# Patient Record
Sex: Male | Born: 1965 | Race: White | Hispanic: No | Marital: Married | Smoking: Current every day smoker
Health system: Southern US, Community
[De-identification: ages and names within clinical notes are randomized; demographics above are authoritative.]

## PROBLEM LIST (undated history)

## (undated) DIAGNOSIS — C801 Malignant (primary) neoplasm, unspecified: Secondary | ICD-10-CM

## (undated) DIAGNOSIS — I219 Acute myocardial infarction, unspecified: Secondary | ICD-10-CM

## (undated) DIAGNOSIS — R053 Chronic cough: Secondary | ICD-10-CM

## (undated) DIAGNOSIS — R05 Cough: Secondary | ICD-10-CM

## (undated) HISTORY — PX: OTHER SURGICAL HISTORY: SHX169

## (undated) HISTORY — PX: NERVE SURGERY: SHX1016

---

## 2009-09-06 ENCOUNTER — Emergency Department (HOSPITAL_COMMUNITY): Admission: EM | Admit: 2009-09-06 | Discharge: 2009-09-06 | Payer: Self-pay | Admitting: Emergency Medicine

## 2009-10-19 ENCOUNTER — Emergency Department: Payer: Self-pay | Admitting: Emergency Medicine

## 2010-03-15 ENCOUNTER — Emergency Department: Payer: Self-pay | Admitting: Internal Medicine

## 2010-03-26 ENCOUNTER — Emergency Department (HOSPITAL_COMMUNITY)
Admission: EM | Admit: 2010-03-26 | Discharge: 2010-03-27 | Disposition: A | Discharge status: Home / Self Care | Payer: Self-pay | Attending: Emergency Medicine | Admitting: Emergency Medicine

## 2010-03-26 DIAGNOSIS — I252 Old myocardial infarction: Secondary | ICD-10-CM | POA: Insufficient documentation

## 2010-03-26 DIAGNOSIS — R1310 Dysphagia, unspecified: Secondary | ICD-10-CM | POA: Insufficient documentation

## 2010-03-26 DIAGNOSIS — J029 Acute pharyngitis, unspecified: Secondary | ICD-10-CM | POA: Insufficient documentation

## 2010-03-26 DIAGNOSIS — E7800 Pure hypercholesterolemia, unspecified: Secondary | ICD-10-CM | POA: Insufficient documentation

## 2010-03-26 DIAGNOSIS — R6883 Chills (without fever): Secondary | ICD-10-CM | POA: Insufficient documentation

## 2010-03-27 LAB — RAPID STREP SCREEN (MED CTR MEBANE ONLY): Streptococcus, Group A Screen (Direct): NEGATIVE

## 2010-03-28 ENCOUNTER — Emergency Department: Payer: Self-pay | Admitting: Emergency Medicine

## 2010-03-28 ENCOUNTER — Emergency Department (HOSPITAL_COMMUNITY)
Admission: EM | Admit: 2010-03-28 | Discharge: 2010-03-28 | Disposition: A | Discharge status: Home / Self Care | Payer: Self-pay | Attending: Emergency Medicine | Admitting: Emergency Medicine

## 2010-03-28 DIAGNOSIS — J029 Acute pharyngitis, unspecified: Secondary | ICD-10-CM | POA: Insufficient documentation

## 2010-04-20 LAB — POCT I-STAT, CHEM 8
Chloride: 105 mEq/L (ref 96–112)
Creatinine, Ser: 0.9 mg/dL (ref 0.4–1.5)
Glucose, Bld: 93 mg/dL (ref 70–99)
HCT: 41 % (ref 39.0–52.0)
Potassium: 3.6 mEq/L (ref 3.5–5.1)
Sodium: 141 mEq/L (ref 135–145)
TCO2: 25 mmol/L (ref 0–100)

## 2010-04-20 LAB — DIFFERENTIAL
Basophils Relative: 1 % (ref 0–1)
Eosinophils Absolute: 0.4 10*3/uL (ref 0.0–0.7)
Eosinophils Relative: 4 % (ref 0–5)
Lymphs Abs: 3.6 10*3/uL (ref 0.7–4.0)
Monocytes Absolute: 0.4 10*3/uL (ref 0.1–1.0)
Neutro Abs: 4.3 10*3/uL (ref 1.7–7.7)
Neutrophils Relative %: 49 % (ref 43–77)

## 2010-04-20 LAB — CBC
Hemoglobin: 13.5 g/dL (ref 13.0–17.0)
MCH: 32.4 pg (ref 26.0–34.0)
MCHC: 34.7 g/dL (ref 30.0–36.0)
Platelets: 203 10*3/uL (ref 150–400)

## 2010-08-11 ENCOUNTER — Emergency Department: Payer: Self-pay | Admitting: Emergency Medicine

## 2010-09-29 ENCOUNTER — Emergency Department: Payer: Self-pay | Admitting: Internal Medicine

## 2010-10-04 ENCOUNTER — Observation Stay: Payer: Self-pay | Admitting: Internal Medicine

## 2011-01-27 ENCOUNTER — Emergency Department: Payer: Self-pay | Admitting: Emergency Medicine

## 2011-01-30 ENCOUNTER — Emergency Department: Payer: Self-pay | Admitting: Emergency Medicine

## 2015-08-03 ENCOUNTER — Emergency Department
Admission: EM | Admit: 2015-08-03 | Discharge: 2015-08-03 | Disposition: A | Discharge status: Home / Self Care | Payer: Self-pay | Attending: Emergency Medicine | Admitting: Emergency Medicine

## 2015-08-03 ENCOUNTER — Emergency Department: Payer: Self-pay

## 2015-08-03 ENCOUNTER — Encounter: Payer: Self-pay | Admitting: Emergency Medicine

## 2015-08-03 DIAGNOSIS — F1290 Cannabis use, unspecified, uncomplicated: Secondary | ICD-10-CM | POA: Insufficient documentation

## 2015-08-03 DIAGNOSIS — R591 Generalized enlarged lymph nodes: Secondary | ICD-10-CM

## 2015-08-03 DIAGNOSIS — F17200 Nicotine dependence, unspecified, uncomplicated: Secondary | ICD-10-CM | POA: Insufficient documentation

## 2015-08-03 DIAGNOSIS — R599 Enlarged lymph nodes, unspecified: Secondary | ICD-10-CM

## 2015-08-03 DIAGNOSIS — J351 Hypertrophy of tonsils: Secondary | ICD-10-CM

## 2015-08-03 LAB — CBC WITH DIFFERENTIAL/PLATELET
BASOS PCT: 1 %
Basophils Absolute: 0.1 10*3/uL (ref 0–0.1)
Eosinophils Absolute: 0.3 10*3/uL (ref 0–0.7)
Eosinophils Relative: 3 %
HEMATOCRIT: 41.5 % (ref 40.0–52.0)
Hemoglobin: 14.3 g/dL (ref 13.0–18.0)
Lymphocytes Relative: 23 %
Lymphs Abs: 1.9 10*3/uL (ref 1.0–3.6)
MCH: 33.6 pg (ref 26.0–34.0)
MCHC: 34.5 g/dL (ref 32.0–36.0)
MCV: 97.5 fL (ref 80.0–100.0)
MONO ABS: 0.8 10*3/uL (ref 0.2–1.0)
MONOS PCT: 10 %
NEUTROS ABS: 5.5 10*3/uL (ref 1.4–6.5)
Neutrophils Relative %: 63 %
Platelets: 217 10*3/uL (ref 150–440)
RBC: 4.26 MIL/uL — ABNORMAL LOW (ref 4.40–5.90)
RDW: 14.2 % (ref 11.5–14.5)
WBC: 8.6 10*3/uL (ref 3.8–10.6)

## 2015-08-03 LAB — COMPREHENSIVE METABOLIC PANEL
ALBUMIN: 4.3 g/dL (ref 3.5–5.0)
ALT: 22 U/L (ref 17–63)
ANION GAP: 10 (ref 5–15)
AST: 30 U/L (ref 15–41)
Alkaline Phosphatase: 146 U/L — ABNORMAL HIGH (ref 38–126)
BILIRUBIN TOTAL: 0.5 mg/dL (ref 0.3–1.2)
BUN: 7 mg/dL (ref 6–20)
CO2: 25 mmol/L (ref 22–32)
Calcium: 9.3 mg/dL (ref 8.9–10.3)
Chloride: 103 mmol/L (ref 101–111)
Creatinine, Ser: 0.87 mg/dL (ref 0.61–1.24)
GLUCOSE: 94 mg/dL (ref 65–99)
POTASSIUM: 3.8 mmol/L (ref 3.5–5.1)
Sodium: 138 mmol/L (ref 135–145)
TOTAL PROTEIN: 7.9 g/dL (ref 6.5–8.1)

## 2015-08-03 LAB — POCT RAPID STREP A: Streptococcus, Group A Screen (Direct): NEGATIVE

## 2015-08-03 MED ORDER — ACETAMINOPHEN-CODEINE #3 300-30 MG PO TABS: 1.0000 | ORAL_TABLET | ORAL | Status: DC | PRN

## 2015-08-03 MED ORDER — IOPAMIDOL (ISOVUE-300) INJECTION 61%
75.0000 mL | Freq: Once | INTRAVENOUS | Status: AC | PRN
  Administered 2015-08-03: 75 mL via INTRAVENOUS
  Filled 2015-08-03: qty 75

## 2015-08-03 NOTE — ED Notes (Signed)
NAD noted at time of D/C. Pt denies questions or concerns. Pt ambulatory to the lobby at this time. Pt refused wheelchair to the lobby.  

## 2015-08-03 NOTE — ED Provider Notes (Signed)
[facility] Emergency Department Provider Note  ____________________________________________  Time seen: Approximately 8:27 AM  I have reviewed the triage vital signs and the nursing notes.   HISTORY  Chief Complaint Sore Throat   HPI Edwin Singh is a 50 y.o. male who presents to the emergency department for evaluation of a knot on the right side of his neck that has been present for the past 3 days along with dysphasia. He denies fever. He has not taken anything for pain.   History reviewed. No pertinent past medical history.  There are no active problems to display for this patient.   Past Surgical History  Procedure Laterality Date  . Arm surgery      Current Outpatient Rx  Name  Route  Sig  Dispense  Refill  . acetaminophen-codeine (TYLENOL #3) 300-30 MG tablet   Oral   Take 1-2 tablets by mouth every 4 (four) hours as needed for moderate pain.   12 tablet   0     Allergies Penicillins  History reviewed. No pertinent family history.  Social History Social History  Substance Use Topics  . Smoking status: Current Every Day Smoker  . Smokeless tobacco: None  . Alcohol Use: 12.0 oz/week    20 Cans of beer per week     Comment: 40 oz beer per day    Review of Systems Constitutional: Negative for fever. Eyes: No visual changes. ENT: Positive for sore throat; negative for difficulty swallowing. Respiratory: Denies shortness of breath. Gastrointestinal: No abdominal pain.  No nausea, no vomiting.  No diarrhea.  Musculoskeletal: Negative for generalized body aches. Skin: Negative for rash. Neurological: Negative for headaches, focal weakness or numbness.  ____________________________________________   PHYSICAL EXAM:  VITAL SIGNS: ED Triage Vitals  Enc Vitals Group     BP 08/03/15 0811 139/83 mmHg     Pulse Rate 08/03/15 0811 88     Resp 08/03/15 0811 20     Temp 08/03/15 0811 98 F (36.7 C)     Temp Source 08/03/15 0811  Oral     SpO2 08/03/15 0811 100 %     Weight 08/03/15 0811 135 lb (61.236 kg)     Height 08/03/15 0811 5\' 10"  (1.778 m)     Head Cir --      Peak Flow --      Pain Score 08/03/15 0812 5     Pain Loc --      Pain Edu? --      Excl. in [place]? --     Constitutional: Alert and oriented. Well appearing and in no acute distress. Eyes: Conjunctivae are normal. PERRL. EOMI. Head: Atraumatic. Nose: No congestion/rhinnorhea. Mouth/Throat: Mucous membranes are moist.  Oropharynx Erythematous, with exudate. Neck: No stridor. Tender, nonmobile mass noted to the right anterior cervical area, likely palpable lymph node.  Cardiovascular: Normal rate, regular rhythm. Good peripheral circulation. Respiratory: Normal respiratory effort. Lungs CTAB. Gastrointestinal: Soft and nontender. Musculoskeletal: No lower extremity tenderness nor edema.   Neurologic:  Normal speech and language. No gross focal neurologic deficits are appreciated. Speech is normal. No gait instability. Skin:  Skin is warm, dry and intact. No rash noted Psychiatric: Mood and affect are normal. Speech and behavior are normal.  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 ____________________________________________   PROCEDURES  Procedure(s) performed: None  Critical Care performed:  No  ____________________________________________   INITIAL IMPRESSION / ASSESSMENT AND PLAN / ED COURSE  Pertinent labs & imaging results that were available during my care of the patient were reviewed by me and considered in my medical decision making (see chart for details).  Rapid strep screen negative. Due to severity of pain, CT soft tissue neck were ordered along with labs. Plan was discussed with patient and significant other who agree.  Possibility of cancerous cause of mass in the right neck was discussed with the patient and significant other. He was strongly encouraged to call and schedule an appointment with ENT for a biopsy. He was advised to call today to schedule an appointment. He agrees to do so. He will be discharged home with tylenol #3 for pain.  ____________________________________________   FINAL CLINICAL IMPRESSION(S) / ED DIAGNOSES  Final diagnoses:  Adenopathy  Tonsillar enlargement      Victorino Dike, FNP 08/03/15 1517  Carrie Mew, MD 08/06/15 1435

## 2015-08-03 NOTE — ED Notes (Signed)
Pt reports a "knot" in his throat x 3 days and painful swallowing. Denies fever, chills. Hx of chronic sinus problems. Pt alert & oriented with NAD noted.

## 2015-08-07 ENCOUNTER — Encounter: Payer: Self-pay | Admitting: Emergency Medicine

## 2015-08-07 ENCOUNTER — Emergency Department
Admission: EM | Admit: 2015-08-07 | Discharge: 2015-08-08 | Disposition: A | Discharge status: Home / Self Care | Payer: Self-pay | Attending: Emergency Medicine | Admitting: Emergency Medicine

## 2015-08-07 DIAGNOSIS — F32A Depression, unspecified: Secondary | ICD-10-CM

## 2015-08-07 DIAGNOSIS — Z85818 Personal history of malignant neoplasm of other sites of lip, oral cavity, and pharynx: Secondary | ICD-10-CM | POA: Insufficient documentation

## 2015-08-07 DIAGNOSIS — F1290 Cannabis use, unspecified, uncomplicated: Secondary | ICD-10-CM | POA: Insufficient documentation

## 2015-08-07 DIAGNOSIS — Z7982 Long term (current) use of aspirin: Secondary | ICD-10-CM | POA: Insufficient documentation

## 2015-08-07 DIAGNOSIS — F329 Major depressive disorder, single episode, unspecified: Secondary | ICD-10-CM | POA: Insufficient documentation

## 2015-08-07 DIAGNOSIS — Z5181 Encounter for therapeutic drug level monitoring: Secondary | ICD-10-CM | POA: Insufficient documentation

## 2015-08-07 DIAGNOSIS — F17210 Nicotine dependence, cigarettes, uncomplicated: Secondary | ICD-10-CM | POA: Insufficient documentation

## 2015-08-07 DIAGNOSIS — F419 Anxiety disorder, unspecified: Secondary | ICD-10-CM

## 2015-08-07 DIAGNOSIS — I252 Old myocardial infarction: Secondary | ICD-10-CM | POA: Insufficient documentation

## 2015-08-07 HISTORY — DX: Malignant (primary) neoplasm, unspecified: C80.1

## 2015-08-07 NOTE — ED Notes (Addendum)
Patient reports he was diagnosed with 6/29 with cancer in his throat. Patient reports he feel "like my nerves are gone..I have so many emotions right now." Patient denies SI at this time. Patient reports he has lost many friends and family members to cancer. Patient reports "it scares me, they told me I had cancer and that was it." Patient states he wants someone to talk to, feels like he is in limbo. Spouse reports patient has not been able to eat or drink due to his anxiety.

## 2015-08-08 LAB — CBC WITH DIFFERENTIAL/PLATELET
Basophils Absolute: 0.1 10*3/uL (ref 0–0.1)
Basophils Relative: 2 %
EOS ABS: 0.3 10*3/uL (ref 0–0.7)
EOS PCT: 5 %
HCT: 44.5 % (ref 40.0–52.0)
HEMOGLOBIN: 15.7 g/dL (ref 13.0–18.0)
LYMPHS ABS: 3.3 10*3/uL (ref 1.0–3.6)
Lymphocytes Relative: 50 %
MCH: 34.4 pg — AB (ref 26.0–34.0)
MCHC: 35.3 g/dL (ref 32.0–36.0)
MCV: 97.4 fL (ref 80.0–100.0)
MONO ABS: 0.4 10*3/uL (ref 0.2–1.0)
MONOS PCT: 6 %
NEUTROS PCT: 37 %
Neutro Abs: 2.4 10*3/uL (ref 1.4–6.5)
Platelets: 189 10*3/uL (ref 150–440)
RBC: 4.57 MIL/uL (ref 4.40–5.90)
RDW: 14.1 % (ref 11.5–14.5)
WBC: 6.5 10*3/uL (ref 3.8–10.6)

## 2015-08-08 LAB — COMPREHENSIVE METABOLIC PANEL
ALBUMIN: 4.3 g/dL (ref 3.5–5.0)
ALK PHOS: 54 U/L (ref 38–126)
ALT: 31 U/L (ref 17–63)
AST: 59 U/L — AB (ref 15–41)
Anion gap: 10 (ref 5–15)
BUN: 9 mg/dL (ref 6–20)
CALCIUM: 9 mg/dL (ref 8.9–10.3)
CO2: 25 mmol/L (ref 22–32)
CREATININE: 0.76 mg/dL (ref 0.61–1.24)
Chloride: 101 mmol/L (ref 101–111)
GFR calc non Af Amer: >60 mL/min (ref >60)
GLUCOSE: 95 mg/dL (ref 65–99)
Potassium: 3.7 mmol/L (ref 3.5–5.1)
SODIUM: 136 mmol/L (ref 135–145)
Total Bilirubin: 0.4 mg/dL (ref 0.3–1.2)
Total Protein: 8.2 g/dL — ABNORMAL HIGH (ref 6.5–8.1)

## 2015-08-08 LAB — URINE DRUG SCREEN, QUALITATIVE (ARMC ONLY)
Amphetamines, Ur Screen: NOT DETECTED
Barbiturates, Ur Screen: NOT DETECTED
Benzodiazepine, Ur Scrn: NOT DETECTED
CANNABINOID 50 NG, UR ~~LOC~~: NOT DETECTED
Cocaine Metabolite,Ur ~~LOC~~: NOT DETECTED
MDMA (ECSTASY) UR SCREEN: NOT DETECTED
METHADONE SCREEN, URINE: NOT DETECTED
Opiate, Ur Screen: NOT DETECTED
PHENCYCLIDINE (PCP) UR S: NOT DETECTED
TRICYCLIC, UR SCREEN: NOT DETECTED

## 2015-08-08 LAB — URINALYSIS COMPLETE WITH MICROSCOPIC (ARMC ONLY)
BACTERIA UA: NONE SEEN
Bilirubin Urine: NEGATIVE
Glucose, UA: NEGATIVE mg/dL
Ketones, ur: NEGATIVE mg/dL
LEUKOCYTES UA: NEGATIVE
NITRITE: NEGATIVE
PH: 5 (ref 5.0–8.0)
PROTEIN: NEGATIVE mg/dL
RBC / HPF: NONE SEEN RBC/hpf (ref 0–5)
SQUAMOUS EPITHELIAL / LPF: NONE SEEN
Specific Gravity, Urine: 1.004 — ABNORMAL LOW (ref 1.005–1.030)

## 2015-08-08 LAB — ACETAMINOPHEN LEVEL: Acetaminophen (Tylenol), Serum: <10 ug/mL — ABNORMAL LOW (ref 10–30)

## 2015-08-08 LAB — ETHANOL: Alcohol, Ethyl (B): 242 mg/dL — ABNORMAL HIGH (ref <5)

## 2015-08-08 LAB — SALICYLATE LEVEL: Salicylate Lvl: <4 mg/dL (ref 2.8–30.0)

## 2015-08-08 MED ORDER — LORAZEPAM 1 MG PO TABS
1.0000 mg | ORAL_TABLET | Freq: Once | ORAL | Status: AC
  Administered 2015-08-08: 1 mg via ORAL
  Filled 2015-08-08: qty 1

## 2015-08-08 MED ORDER — LORAZEPAM 1 MG PO TABS: 1.0000 mg | ORAL_TABLET | Freq: Three times a day (TID) | ORAL | Status: DC | PRN

## 2015-08-08 NOTE — ED Provider Notes (Signed)
[facility] Emergency Department Provider Note   ____________________________________________  Time seen: Approximately 2:14 AM  I have reviewed the triage vital signs and the nursing notes.   HISTORY  Chief Complaint Depression    HPI Edwin Singh is a 50 y.o. male who presents to the ED from home with a chief complaint of anxiety and depression. Patient was recently diagnosed with throat cancer and feels like "my nerves are shot". Reports multiple family members have died from cancer. Patient denies SI/HI/AH/VH. He has a will to live and is proceeding with treatment; surgery scheduled in 2 weeks. Reports he just wants someone to talk to. States he has been drinking to soothe his anxiety. Voices no medical complaints at this time.   Past Medical History  Diagnosis Date  . Cancer ([place])     throat cancer    There are no active problems to display for this patient.   Past Surgical History  Procedure Laterality Date  . Arm surgery      Current Outpatient Rx  Name  Route  Sig  Dispense  Refill  . acetaminophen-codeine (TYLENOL #3) 300-30 MG tablet   Oral   Take 1-2 tablets by mouth every 4 (four) hours as needed for moderate pain.   12 tablet   0   . aspirin 81 MG tablet   Oral   Take 81 mg by mouth daily.         Marland Kitchen LORazepam (ATIVAN) 1 MG tablet   Oral   Take 1 tablet (1 mg total) by mouth every 8 (eight) hours as needed for anxiety.   15 tablet   0     Allergies Penicillins  No family history on file.  Social History Social History  Substance Use Topics  . Smoking status: Current Every Day Smoker -- 1.50 packs/day    Types: Cigarettes  . Smokeless tobacco: None  . Alcohol Use: 12.0 oz/week    20 Cans of beer per week     Comment: 40 oz beer per day    Review of Systems  Constitutional: No fever/chills. Eyes: No visual changes. ENT: No sore throat. Cardiovascular: Denies chest pain. Respiratory: Denies shortness of  breath. Gastrointestinal: No abdominal pain.  No nausea, no vomiting.  No diarrhea.  No constipation. Genitourinary: Negative for dysuria. Musculoskeletal: Negative for back pain. Skin: Negative for rash. Neurological: Negative for headaches, focal weakness or numbness. Psychiatric:Positive for anxiety and depression.  10-point ROS otherwise negative.  ____________________________________________   PHYSICAL EXAM:  VITAL SIGNS: ED Triage Vitals  Enc Vitals Group     BP 08/07/15 2304 148/89 mmHg     Pulse Rate 08/07/15 2304 98     Resp 08/07/15 2304 18     Temp 08/07/15 2304 97.9 F (36.6 C)     Temp Source 08/07/15 2304 Oral     SpO2 08/07/15 2304 96 %     Weight 08/07/15 2304 135 lb (61.236 kg)     Height 08/07/15 2304 5\' 10"  (1.778 m)     Head Cir --      Peak Flow --      Pain Score 08/07/15 2305 0     Pain Loc --      Pain Edu? --      Excl. in [place]? --     Constitutional: Alert and oriented. Well appearing and in mild acute distress secondary to anxiety. Pacing. Eyes: Conjunctivae are normal. PERRL. EOMI. Head: Atraumatic. Nose: No congestion/rhinnorhea. Mouth/Throat: Mucous membranes  are moist.  Oropharynx non-erythematous. Neck: No stridor.   Cardiovascular: Normal rate, regular rhythm. Grossly normal heart sounds.  Good peripheral circulation. Respiratory: Normal respiratory effort.  No retractions. Lungs CTAB. Gastrointestinal: Soft and nontender. No distention. No abdominal bruits. No CVA tenderness. Musculoskeletal: No lower extremity tenderness nor edema.  No joint effusions. Neurologic:  Normal speech and language. No gross focal neurologic deficits are appreciated. No gait instability. Skin:  Skin is warm, dry and intact. No rash noted. Psychiatric: Mood and affect anxious. Speech and behavior are normal.  ____________________________________________   LABS (all labs ordered are listed, but only abnormal results are displayed)  Labs Reviewed  CBC  WITH DIFFERENTIAL/PLATELET - Abnormal; Notable for the following:    MCH 34.4 (*)    All other components within normal limits  COMPREHENSIVE METABOLIC PANEL - Abnormal; Notable for the following:    Total Protein 8.2 (*)    AST 59 (*)    All other components within normal limits  ETHANOL - Abnormal; Notable for the following:    Alcohol, Ethyl (B) 242 (*)    All other components within normal limits  ACETAMINOPHEN LEVEL - Abnormal; Notable for the following:    Acetaminophen (Tylenol), Serum <10 (*)    All other components within normal limits  URINALYSIS COMPLETEWITH MICROSCOPIC ([facility] ONLY) - Abnormal; Notable for the following:    Color, Urine STRAW (*)    APPearance CLEAR (*)    Specific Gravity, Urine 1.004 (*)    Hgb urine dipstick 1+ (*)    All other components within normal limits  SALICYLATE LEVEL  URINE DRUG SCREEN, QUALITATIVE ([facility] ONLY)   ____________________________________________  EKG  None ____________________________________________  RADIOLOGY  None ____________________________________________   PROCEDURES  Procedure(s) performed: None  Procedures  Critical Care performed: No  ____________________________________________   INITIAL IMPRESSION / ASSESSMENT AND PLAN / ED COURSE  Pertinent labs & imaging results that were available during my care of the patient were reviewed by me and considered in my medical decision making (see chart for details).  50 year old male who presents for evaluation of anxiety and depression without suicidal ideation. This is in the setting of recent diagnosis of throat cancer. He spoke with TTS and feels much better. Will prescribe low-dose lorazepam and limited quantity patient will follow-up with RTS. Noted elevated alcohol level. Visualized patient ambulating with steady gait and he is going home with a family member. Strict return precautions given. Both verbalize understanding and agree with plan of  care. ____________________________________________   FINAL CLINICAL IMPRESSION(S) / ED DIAGNOSES  Final diagnoses:  Anxiety  Depression      NEW MEDICATIONS STARTED DURING THIS VISIT:  New Prescriptions   LORAZEPAM (ATIVAN) 1 MG TABLET    Take 1 tablet (1 mg total) by mouth every 8 (eight) hours as needed for anxiety.     Note:  This document was prepared using Dragon voice recognition software and may include unintentional dictation errors.    Paulette Blanch, MD 08/08/15 [phone]

## 2015-08-08 NOTE — ED Notes (Signed)
Patient temporarily moved to [facility] for TTS consult

## 2015-08-08 NOTE — ED Notes (Addendum)
Keshia requested I bring patient and wife a Coke and a cup of ice.

## 2015-08-08 NOTE — BH Assessment (Signed)
Assessment Note  Edwin Singh is an 50 y.o. male. Daun Peacock arrived to the ED by way of personal transportation.  He reports that he found out Thursday that he has cancer of the throat  and  "I am scared shitless".   He reports that he cannot sleep or eat.  He reports that in the last couple of years he has lost 20-25 people to cancer.  His wife reports that he has not really eaten since Thursday.  He reports that he has been very depressed. He has been extremely anxious.  He received the news of the cancer and scheduled his appointment for surgery without any follow up conversation on what he is to expect or consultations.  He denied suicidal or homicidal ideation or intent.  He denied auditory or visual hallucinations.  He reports being worried and concerned as this will impact his working, financially he does not feel that he can afford the procedures, and he is concerned that he will not survive.   Diagnosis: Anxiety  Past Medical History:  Past Medical History  Diagnosis Date  . Cancer ([place])     throat cancer    Past Surgical History  Procedure Laterality Date  . Arm surgery      Family History: No family history on file.  Social History:  reports that he has been smoking Cigarettes.  He has been smoking about 1.50 packs per day. He does not have any smokeless tobacco history on file. He reports that he drinks about 12.0 oz of alcohol per week. He reports that he uses illicit drugs (Marijuana).  Additional Social History:  Alcohol / Drug Use History of alcohol / drug use?: Yes Substance #1 Name of Substance 1: Alcohol 1 - Age of First Use: 8 1 - Amount (size/oz): 2-40oz  1 - Frequency: daily 1 - Last Use / Amount: 08/07/2015 Substance #2 Name of Substance 2: Marijuana 2 - Age of First Use: 14 2 - Amount (size/oz): "a tote here and a tote there" 2 - Frequency: occassionally 2 - Last Use / Amount: 08/05/2015  CIWA: CIWA-Ar BP: (!) 148/89 mmHg Pulse Rate: 98 COWS:     Allergies:  Allergies  Allergen Reactions  . Penicillins Anaphylaxis    Childhood reaction Has patient had a PCN reaction causing immediate rash, facial/tongue/throat swelling, SOB or lightheadedness with hypotension: yes Has patient had a PCN reaction causing severe rash involving mucus membranes or skin necrosis: yes Has patient had a PCN reaction that required hospitalization yes Has patient had a PCN reaction occurring within the last 10 years: no If all of the above answers are "NO", then may proceed with Cephalosporin use.    Home Medications:  (Not in a hospital admission)  OB/GYN Status:  No LMP for male patient.  General Assessment Data Location of Assessment: [facility] ED TTS Assessment: In system Is this a Tele or Face-to-Face Assessment?: Face-to-Face Is this an Initial Assessment or a Re-assessment for this encounter?: Initial Assessment Marital status: Married [place] name: n/a Is patient pregnant?: No Pregnancy Status: No Living Arrangements: Spouse/significant other Can pt return to current living arrangement?: Yes Admission Status: Voluntary Is patient capable of signing voluntary admission?: Yes Referral Source: Self/Family/Friend Insurance type: None  Medical Screening Exam ([place]) Medical Exam completed: Yes  Crisis Care Plan Living Arrangements: Spouse/significant other Legal Guardian: Other: (Self) Name of Psychiatrist: None Name of Therapist: None  Education Status Is patient currently in school?: No Current Grade: n/a Highest grade of school  patient has completed: GED Name of school: [employer] person: N/a  Risk to self with the past 6 months Suicidal Ideation: No Has patient been a risk to self within the past 6 months prior to admission? : No Suicidal Intent: No Has patient had any suicidal intent within the past 6 months prior to admission? : No Is patient at risk for suicide?: No Suicidal Plan?: No Has patient had any  suicidal plan within the past 6 months prior to admission? : No Access to Means: No What has been your use of drugs/alcohol within the last 12 months?: use of alcohol and marijuana Previous Attempts/Gestures: No How many times?: 0 Other Self Harm Risks: denied Triggers for Past Attempts: None known Intentional Self Injurious Behavior: None Family Suicide History: No Recent stressful life event(s): Financial Problems (Illness, decrease in workload) Persecutory voices/beliefs?: No Depression: Yes Depression Symptoms: Despondent Substance abuse history and/or treatment for substance abuse?: Yes Suicide prevention information given to non-admitted patients: Not applicable  Risk to Others within the past 6 months Homicidal Ideation: No Does patient have any lifetime risk of violence toward others beyond the six months prior to admission? : No Thoughts of Harm to Others: No Current Homicidal Intent: No Current Homicidal Plan: No Access to Homicidal Means: No Identified Victim: None identified History of harm to others?: No Assessment of Violence: None Noted Violent Behavior Description: none Does patient have access to weapons?: No Criminal Charges Pending?: No Does patient have a court date: No Is patient on probation?: No  Psychosis Hallucinations: None noted Delusions: None noted  Mental Status Report Appearance/Hygiene: Unremarkable Eye Contact: Fair Motor Activity: Agitation Speech: Logical/coherent Anxiety Level: Moderate Thought Processes: Coherent Judgement: Unimpaired Orientation: Person, Place, Time, Situation, Appropriate for developmental age Obsessive Compulsive Thoughts/Behaviors: None  Cognitive Functioning Concentration: Normal Memory: Recent Intact IQ: Average Insight: Fair Impulse Control: Fair Appetite: Poor Sleep: Decreased Vegetative Symptoms: None  ADLScreening [facility] Assessment Services) Patient's cognitive ability adequate to safely complete  daily activities?: Yes Patient able to express need for assistance with ADLs?: Yes Independently performs ADLs?: Yes (appropriate for developmental age)  Prior Inpatient Therapy Prior Inpatient Therapy: No Prior Therapy Dates: n/a Prior Therapy Facilty/Provider(s): n/a Reason for Treatment: n/a  Prior Outpatient Therapy Prior Outpatient Therapy: No Prior Therapy Dates: n/a Prior Therapy Facilty/Provider(s): n/a Reason for Treatment: n/a Does patient have an ACCT team?: No Does patient have Intensive In-House Services?  : No Does patient have [facility] services? : No Does patient have P4CC services?: No  ADL Screening (condition at time of admission) Patient's cognitive ability adequate to safely complete daily activities?: Yes Patient able to express need for assistance with ADLs?: Yes Independently performs ADLs?: Yes (appropriate for developmental age)       Abuse/Neglect Assessment (Assessment to be complete while patient is alone) Physical Abuse: Denies Verbal Abuse: Denies Sexual Abuse: Denies Exploitation of patient/patient's resources: Denies Self-Neglect: Denies     Regulatory affairs officer (For Healthcare) Does patient have an advance directive?: No Would patient like information on creating an advanced directive?: No - patient declined information    Additional Information 1:1 In Past 12 Months?: No CIRT Risk: No Elopement Risk: No Does patient have medical clearance?: Yes     Disposition:  Disposition Initial Assessment Completed for this Encounter: Yes Disposition of Patient: Other dispositions  On Site Evaluation by:   Reviewed with Physician:    Elmer Bales 08/08/2015 1:35 AM

## 2015-08-08 NOTE — ED Notes (Signed)

## 2015-08-08 NOTE — Discharge Instructions (Signed)
1. You may take anxiety medicine as needed (Ativan #15). 2. Return to the ER for worsening symptoms, feelings of hurting yourself or others, or other concerns.  Generalized Anxiety Disorder Generalized anxiety disorder (GAD) is a mental disorder. It interferes with life functions, including relationships, work, and school. GAD is different from normal anxiety, which everyone experiences at some point in their lives in response to specific life events and activities. Normal anxiety actually helps us prepare for and get through these life events and activities. Normal anxiety goes away after the event or activity is over.  GAD causes anxiety that is not necessarily related to specific events or activities. It also causes excess anxiety in proportion to specific events or activities. The anxiety associated with GAD is also difficult to control. GAD can vary from mild to severe. People with severe GAD can have intense waves of anxiety with physical symptoms (panic attacks).  SYMPTOMS The anxiety and worry associated with GAD are difficult to control. This anxiety and worry are related to many life events and activities and also occur more days than not for 6 months or longer. People with GAD also have three or more of the following symptoms (one or more in children):  Restlessness.   Fatigue.  Difficulty concentrating.   Irritability.  Muscle tension.  Difficulty sleeping or unsatisfying sleep. DIAGNOSIS GAD is diagnosed through an assessment by your health care provider. Your health care provider will ask you questions aboutyour mood,physical symptoms, and events in your life. Your health care provider may ask you about your medical history and use of alcohol or drugs, including prescription medicines. Your health care provider may also do a physical exam and blood tests. Certain medical conditions and the use of certain substances can cause symptoms similar to those associated with GAD. Your  health care provider may refer you to a mental health specialist for further evaluation. TREATMENT The following therapies are usually used to treat GAD:   Medication. Antidepressant medication usually is prescribed for long-term daily control. Antianxiety medicines may be added in severe cases, especially when panic attacks occur.   Talk therapy (psychotherapy). Certain types of talk therapy can be helpful in treating GAD by providing support, education, and guidance. A form of talk therapy called cognitive behavioral therapy can teach you healthy ways to think about and react to daily life events and activities.  Stress managementtechniques. These include yoga, meditation, and exercise and can be very helpful when they are practiced regularly. A mental health specialist can help determine which treatment is best for you. Some people see improvement with one therapy. However, other people require a combination of therapies.   This information is not intended to replace advice given to you by your health care provider. Make sure you discuss any questions you have with your health care provider.   Document Released: 05/18/2012 Document Revised: 02/11/2014 Document Reviewed: 05/18/2012 Elsevier Interactive Patient Education 2016 [employer]  Major Depressive Disorder Major depressive disorder is a mental illness. It also may be called clinical depression or unipolar depression. Major depressive disorder usually causes feelings of sadness, hopelessness, or helplessness. Some people with this disorder do not feel particularly sad but lose interest in doing things they used to enjoy (anhedonia). Major depressive disorder also can cause physical symptoms. It can interfere with work, school, relationships, and other normal everyday activities. The disorder varies in severity but is longer lasting and more serious than the sadness we all feel from time to time in our  lives. Major depressive disorder  often is triggered by stressful life events or major life changes. Examples of these triggers include divorce, loss of your job or home, a move, and the death of a family member or close friend. Sometimes this disorder occurs for no obvious reason at all. People who have family members with major depressive disorder or bipolar disorder are at higher risk for developing this disorder, with or without life stressors. Major depressive disorder can occur at any age. It may occur just once in your life (single episode major depressive disorder). It may occur multiple times (recurrent major depressive disorder). SYMPTOMS People with major depressive disorder have either anhedonia or depressed mood on nearly a daily basis for at least 2 weeks or longer. Symptoms of depressed mood include:  Feelings of sadness (blue or down in the dumps) or emptiness.  Feelings of hopelessness or helplessness.  Tearfulness or episodes of crying (may be observed by others).  Irritability (children and adolescents). In addition to depressed mood or anhedonia or both, people with this disorder have at least four of the following symptoms:  Difficulty sleeping or sleeping too much.   Significant change (increase or decrease) in appetite or weight.   Lack of energy or motivation.  Feelings of guilt and worthlessness.   Difficulty concentrating, remembering, or making decisions.  Unusually slow movement (psychomotor retardation) or restlessness (as observed by others).   Recurrent wishes for death, recurrent thoughts of self-harm (suicide), or a suicide attempt. People with major depressive disorder commonly have persistent negative thoughts about themselves, other people, and the world. People with severe major depressive disorder may experiencedistorted beliefs or perceptions about the world (psychotic delusions). They also may see or hear things that are not real (psychotic hallucinations). DIAGNOSIS Major  depressive disorder is diagnosed through an assessment by your health care provider. Your health care provider will ask aboutaspects of your daily life, such as mood,sleep, and appetite, to see if you have the diagnostic symptoms of major depressive disorder. Your health care provider may ask about your medical history and use of alcohol or drugs, including prescription medicines. Your health care provider also may do a physical exam and blood work. This is because certain medical conditions and the use of certain substances can cause major depressive disorder-like symptoms (secondary depression). Your health care provider also may refer you to a mental health specialist for further evaluation and treatment. TREATMENT It is important to recognize the symptoms of major depressive disorder and seek treatment. The following treatments can be prescribed for this disorder:   Medicine. Antidepressant medicines usually are prescribed. Antidepressant medicines are thought to correct chemical imbalances in the brain that are commonly associated with major depressive disorder. Other types of medicine may be added if the symptoms do not respond to antidepressant medicines alone or if psychotic delusions or hallucinations occur.  Talk therapy. Talk therapy can be helpful in treating major depressive disorder by providing support, education, and guidance. Certain types of talk therapy also can help with negative thinking (cognitive behavioral therapy) and with relationship issues that trigger this disorder (interpersonal therapy). A mental health specialist can help determine which treatment is best for you. Most people with major depressive disorder do well with a combination of medicine and talk therapy. Treatments involving electrical stimulation of the brain can be used in situations with extremely severe symptoms or when medicine and talk therapy do not work over time. These treatments include electroconvulsive  therapy, transcranial magnetic stimulation, and vagal nerve  stimulation.   This information is not intended to replace advice given to you by your health care provider. Make sure you discuss any questions you have with your health care provider.   Document Released: 05/18/2012 Document Revised: 02/11/2014 Document Reviewed: 05/18/2012 Elsevier Interactive Patient Education [employer].

## 2015-08-08 NOTE — ED Notes (Signed)
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  He was seeking tx at [facility] for a sore throat but during CT scan the mass was found.  Patient does have surgery scheduled for Jul 13th, but right now feels he is out of control, "in limbo", and powerless.  I tried reinforcing that by choosing treatment that he was the one in control.  Pt has a hx of schizophrenia and he said he was taking Zoloft but he quit due to it making him feel "weird inside".  He also said he was taking some Xanax at some point but stopped taking it even though it seemed to help him.  Pt also said he has been a "lifelong alcoholic".  He denies SI/HI and is intermittentlt crying as he's talking.  I brought his wife back to his bed for support.

## 2015-08-08 NOTE — ED Notes (Signed)
Patient is acting anxious and intermittently standing in and out of the door of the room.

## 2015-08-08 NOTE — ED Notes (Signed)
Edwin Singh came out of room 18

## 2015-08-09 ENCOUNTER — Other Ambulatory Visit: Payer: Self-pay

## 2015-08-11 ENCOUNTER — Encounter
Admission: RE | Admit: 2015-08-11 | Discharge: 2015-08-11 | Disposition: A | Discharge status: Home / Self Care | Payer: Self-pay | Source: Ambulatory Visit | Attending: Otolaryngology | Admitting: Otolaryngology

## 2015-08-11 DIAGNOSIS — I213 ST elevation (STEMI) myocardial infarction of unspecified site: Secondary | ICD-10-CM

## 2015-08-11 DIAGNOSIS — R590 Localized enlarged lymph nodes: Secondary | ICD-10-CM | POA: Insufficient documentation

## 2015-08-11 DIAGNOSIS — Z01812 Encounter for preprocedural laboratory examination: Secondary | ICD-10-CM | POA: Insufficient documentation

## 2015-08-11 DIAGNOSIS — Z01810 Encounter for preprocedural cardiovascular examination: Secondary | ICD-10-CM | POA: Insufficient documentation

## 2015-08-11 DIAGNOSIS — R221 Localized swelling, mass and lump, neck: Secondary | ICD-10-CM | POA: Insufficient documentation

## 2015-08-11 HISTORY — DX: Cough: R05

## 2015-08-11 HISTORY — DX: Chronic cough: R05.3

## 2015-08-11 HISTORY — DX: Acute myocardial infarction, unspecified: I21.9

## 2015-08-11 NOTE — Patient Instructions (Signed)
  Your procedure is scheduled on: 08/17/15 Thurs Report to Same Day Surgery 2nd floor medical mall To find out your arrival time please call [phone] between 1PM - 3PM on 08/16/15 Wed  Remember: Instructions that are not followed completely may result in serious medical risk, up to and including death, or upon the discretion of your surgeon and anesthesiologist your surgery may need to be rescheduled.    _x___ 1. Do not eat food or drink liquids after midnight. No gum chewing or hard candies.     __x__ 2. No Alcohol for 24 hours before or after surgery.   __x__3. No Smoking for 24 prior to surgery.   ____  4. Bring all medications with you on the day of surgery if instructed.    __x__ 5. Notify your doctor if there is any change in your medical condition     (cold, fever, infections).     Do not wear jewelry, make-up, hairpins, clips or nail polish.  Do not wear lotions, powders, or perfumes. You may wear deodorant.  Do not shave 48 hours prior to surgery. Men may shave face and neck.  Do not bring valuables to the hospital.    [facility] is not responsible for any belongings or valuables.               Contacts, dentures or bridgework may not be worn into surgery.  Leave your suitcase in the car. After surgery it may be brought to your room.  For patients admitted to the hospital, discharge time is determined by your treatment team.   Patients discharged the day of surgery will not be allowed to drive home.    Please read over the following fact sheets that you were given:   [facility] Preparing for Surgery and or MRSA Information   _x___ Take these medicines the morning of surgery with A SIP OF WATER:    1. LORazepam (ATIVAN) 1 MG tablet  2.oxycodone (OXY-IR) 5 MG capsule  3.  4.  5.  6.  ____ Fleet Enema (as directed)   _x___ Use CHG Soap or sage wipes as directed on instruction sheet   ____ Use inhalers on the day of surgery and bring to hospital day of  surgery  ____ Stop metformin 2 days prior to surgery    ____ Take 1/2 of usual insulin dose the night before surgery and none on the morning of           surgery.   __x__ Stop aspirin or coumadin, or plavix Stop aspirin today  _x__ Stop Anti-inflammatories such as Advil, Aleve, Ibuprofen, Motrin, Naproxen,          Naprosyn, Goodies powders or aspirin products. Ok to take Tylenol.   ____ Stop supplements until after surgery.    ____ Bring C-Pap to the hospital.

## 2015-08-17 ENCOUNTER — Ambulatory Visit: Payer: Self-pay | Admitting: Anesthesiology

## 2015-08-17 ENCOUNTER — Encounter
Admission: RE | Disposition: A | Discharge status: Home / Self Care | Payer: Self-pay | Source: Ambulatory Visit | Attending: Otolaryngology

## 2015-08-17 ENCOUNTER — Ambulatory Visit
Admission: RE | Admit: 2015-08-17 | Discharge: 2015-08-17 | Disposition: A | Discharge status: Home / Self Care | Payer: Self-pay | Source: Ambulatory Visit | Attending: Otolaryngology | Admitting: Otolaryngology

## 2015-08-17 DIAGNOSIS — I252 Old myocardial infarction: Secondary | ICD-10-CM | POA: Insufficient documentation

## 2015-08-17 DIAGNOSIS — R1310 Dysphagia, unspecified: Secondary | ICD-10-CM | POA: Insufficient documentation

## 2015-08-17 DIAGNOSIS — R590 Localized enlarged lymph nodes: Secondary | ICD-10-CM | POA: Insufficient documentation

## 2015-08-17 DIAGNOSIS — Z9889 Other specified postprocedural states: Secondary | ICD-10-CM | POA: Insufficient documentation

## 2015-08-17 DIAGNOSIS — Z880 Allergy status to penicillin: Secondary | ICD-10-CM | POA: Insufficient documentation

## 2015-08-17 DIAGNOSIS — Z86718 Personal history of other venous thrombosis and embolism: Secondary | ICD-10-CM | POA: Insufficient documentation

## 2015-08-17 DIAGNOSIS — Z8249 Family history of ischemic heart disease and other diseases of the circulatory system: Secondary | ICD-10-CM | POA: Insufficient documentation

## 2015-08-17 DIAGNOSIS — Z833 Family history of diabetes mellitus: Secondary | ICD-10-CM | POA: Insufficient documentation

## 2015-08-17 DIAGNOSIS — J039 Acute tonsillitis, unspecified: Secondary | ICD-10-CM | POA: Insufficient documentation

## 2015-08-17 DIAGNOSIS — F17200 Nicotine dependence, unspecified, uncomplicated: Secondary | ICD-10-CM | POA: Insufficient documentation

## 2015-08-17 DIAGNOSIS — J449 Chronic obstructive pulmonary disease, unspecified: Secondary | ICD-10-CM | POA: Insufficient documentation

## 2015-08-17 DIAGNOSIS — Z87892 Personal history of anaphylaxis: Secondary | ICD-10-CM | POA: Insufficient documentation

## 2015-08-17 DIAGNOSIS — M1990 Unspecified osteoarthritis, unspecified site: Secondary | ICD-10-CM | POA: Insufficient documentation

## 2015-08-17 DIAGNOSIS — Z800 Family history of malignant neoplasm of digestive organs: Secondary | ICD-10-CM | POA: Insufficient documentation

## 2015-08-17 DIAGNOSIS — J3501 Chronic tonsillitis: Secondary | ICD-10-CM | POA: Insufficient documentation

## 2015-08-17 DIAGNOSIS — Z7982 Long term (current) use of aspirin: Secondary | ICD-10-CM | POA: Insufficient documentation

## 2015-08-17 DIAGNOSIS — F1020 Alcohol dependence, uncomplicated: Secondary | ICD-10-CM | POA: Insufficient documentation

## 2015-08-17 DIAGNOSIS — Z791 Long term (current) use of non-steroidal anti-inflammatories (NSAID): Secondary | ICD-10-CM | POA: Insufficient documentation

## 2015-08-17 HISTORY — PX: TONSILLECTOMY: SHX5217

## 2015-08-17 LAB — URINE DRUG SCREEN, QUALITATIVE (ARMC ONLY)
Amphetamines, Ur Screen: NOT DETECTED
Barbiturates, Ur Screen: NOT DETECTED
Benzodiazepine, Ur Scrn: POSITIVE — AB
CANNABINOID 50 NG, UR ~~LOC~~: NOT DETECTED
COCAINE METABOLITE, UR ~~LOC~~: NOT DETECTED
MDMA (ECSTASY) UR SCREEN: NOT DETECTED
Methadone Scn, Ur: NOT DETECTED
OPIATE, UR SCREEN: NOT DETECTED
Phencyclidine (PCP) Ur S: NOT DETECTED
TRICYCLIC, UR SCREEN: NOT DETECTED

## 2015-08-17 SURGERY — TONSILLECTOMY
Anesthesia: General | Laterality: Right | Wound class: Clean Contaminated

## 2015-08-17 MED ORDER — OXYCODONE HCL 5 MG/5ML PO SOLN: 10.0000 mg | ORAL | Status: DC | PRN

## 2015-08-17 MED ORDER — OXYCODONE HCL 5 MG/5ML PO SOLN
ORAL | Status: AC
  Filled 2015-08-17: qty 10

## 2015-08-17 MED ORDER — BUPIVACAINE HCL 0.5 % IJ SOLN
INTRAMUSCULAR | Status: DC | PRN
  Administered 2015-08-17: 1.5 mL

## 2015-08-17 MED ORDER — FENTANYL CITRATE (PF) 100 MCG/2ML IJ SOLN: 25.0000 ug | INTRAMUSCULAR | Status: DC | PRN

## 2015-08-17 MED ORDER — LIDOCAINE VISCOUS 2 % MT SOLN: 10.0000 mL | Freq: Four times a day (QID) | OROMUCOSAL | Status: DC | PRN

## 2015-08-17 MED ORDER — DEXAMETHASONE SODIUM PHOSPHATE 10 MG/ML IJ SOLN
INTRAMUSCULAR | Status: DC | PRN
  Administered 2015-08-17: 10 mg via INTRAVENOUS

## 2015-08-17 MED ORDER — ACETAMINOPHEN 10 MG/ML IV SOLN
INTRAVENOUS | Status: DC | PRN
  Administered 2015-08-17: 1000 mg via INTRAVENOUS

## 2015-08-17 MED ORDER — BUPIVACAINE HCL (PF) 0.5 % IJ SOLN
INTRAMUSCULAR | Status: AC
  Filled 2015-08-17: qty 30

## 2015-08-17 MED ORDER — FAMOTIDINE 20 MG PO TABS
ORAL_TABLET | ORAL | Status: AC
  Administered 2015-08-17: 20 mg via ORAL
  Filled 2015-08-17: qty 1

## 2015-08-17 MED ORDER — ACETAMINOPHEN 10 MG/ML IV SOLN
INTRAVENOUS | Status: AC
  Filled 2015-08-17: qty 100

## 2015-08-17 MED ORDER — SUGAMMADEX SODIUM 200 MG/2ML IV SOLN
INTRAVENOUS | Status: DC | PRN
  Administered 2015-08-17: 122.4 mg via INTRAVENOUS

## 2015-08-17 MED ORDER — FAMOTIDINE 20 MG PO TABS
20.0000 mg | ORAL_TABLET | Freq: Once | ORAL | Status: AC
  Administered 2015-08-17: 20 mg via ORAL

## 2015-08-17 MED ORDER — FENTANYL CITRATE (PF) 100 MCG/2ML IJ SOLN
INTRAMUSCULAR | Status: DC | PRN
  Administered 2015-08-17 (×2): 50 ug via INTRAVENOUS
  Administered 2015-08-17: 100 ug via INTRAVENOUS

## 2015-08-17 MED ORDER — SUCCINYLCHOLINE CHLORIDE 20 MG/ML IJ SOLN
INTRAMUSCULAR | Status: DC | PRN
  Administered 2015-08-17: 90 mg via INTRAVENOUS

## 2015-08-17 MED ORDER — PROPOFOL 10 MG/ML IV BOLUS
INTRAVENOUS | Status: DC | PRN
  Administered 2015-08-17: 130 mg via INTRAVENOUS
  Administered 2015-08-17: 50 mg via INTRAVENOUS

## 2015-08-17 MED ORDER — LIDOCAINE HCL (CARDIAC) 20 MG/ML IV SOLN
INTRAVENOUS | Status: DC | PRN
  Administered 2015-08-17: 40 mg via INTRAVENOUS

## 2015-08-17 MED ORDER — PROMETHAZINE HCL 12.5 MG PO TABS: 12.5000 mg | ORAL_TABLET | Freq: Four times a day (QID) | ORAL | Status: DC | PRN

## 2015-08-17 MED ORDER — ONDANSETRON HCL 4 MG/2ML IJ SOLN
INTRAMUSCULAR | Status: DC | PRN
  Administered 2015-08-17: 4 mg via INTRAVENOUS

## 2015-08-17 MED ORDER — LACTATED RINGERS IV SOLN
INTRAVENOUS | Status: DC
  Administered 2015-08-17: 06:00:00 via INTRAVENOUS

## 2015-08-17 MED ORDER — MIDAZOLAM HCL 2 MG/2ML IJ SOLN
INTRAMUSCULAR | Status: DC | PRN
  Administered 2015-08-17 (×2): 1 mg via INTRAVENOUS

## 2015-08-17 MED ORDER — ROCURONIUM BROMIDE 100 MG/10ML IV SOLN
INTRAVENOUS | Status: DC | PRN
  Administered 2015-08-17: 5 mg via INTRAVENOUS
  Administered 2015-08-17: 30 mg via INTRAVENOUS

## 2015-08-17 MED ORDER — OXYCODONE HCL 5 MG/5ML PO SOLN
10.0000 mg | ORAL | Status: DC | PRN
  Administered 2015-08-17: 10 mg via ORAL

## 2015-08-17 MED ORDER — ONDANSETRON HCL 4 MG/2ML IJ SOLN: 4.0000 mg | Freq: Once | INTRAMUSCULAR | Status: DC | PRN

## 2015-08-17 SURGICAL SUPPLY — 14 items
BLADE BOVIE TIP EXT 4 (BLADE) ×3 IMPLANT
CANISTER SUCT 1200ML W/VALVE (MISCELLANEOUS) ×3 IMPLANT
CATH ROBINSON RED A/P 10FR (CATHETERS) ×3 IMPLANT
CATH ROBINSON RED A/P 14FR (CATHETERS) ×3 IMPLANT
COAG SUCT 10F 3.5MM HAND CTRL (MISCELLANEOUS) ×3 IMPLANT
ELECT REM PT RETURN 9FT ADLT (ELECTROSURGICAL) ×3
ELECTRODE REM PT RTRN 9FT ADLT (ELECTROSURGICAL) ×1 IMPLANT
GLOVE BIO SURGEON STRL SZ7.5 (GLOVE) ×6 IMPLANT
HANDLE SUCTION POOLE (INSTRUMENTS) ×1 IMPLANT
KIT RM TURNOVER STRD PROC AR (KITS) ×3 IMPLANT
NS IRRIG 500ML POUR BTL (IV SOLUTION) ×3 IMPLANT
PACK HEAD/NECK (MISCELLANEOUS) ×3 IMPLANT
SUCTION POOLE HANDLE (INSTRUMENTS) ×3
SYR 3ML LL SCALE MARK (SYRINGE) ×3 IMPLANT

## 2015-08-17 NOTE — H&P (Signed)
..  History and Physical paper copy reviewed and updated date of procedure and will be scanned into system.  

## 2015-08-17 NOTE — Anesthesia Procedure Notes (Signed)
Procedure Name: Intubation Performed by: Lance Muss Pre-anesthesia Checklist: Patient identified, Patient being monitored, Timeout performed, Emergency Drugs available and Suction available Patient Re-evaluated:Patient Re-evaluated prior to inductionOxygen Delivery Method: Circle system utilized Preoxygenation: Pre-oxygenation with 100% oxygen Intubation Type: IV induction Ventilation: Mask ventilation without difficulty and Oral airway inserted - appropriate to patient size Laryngoscope Size: Mac and 3 Grade View: Grade I Tube type: Oral Rae Tube size: 7.5 mm Number of attempts: 1 Airway Equipment and Method: Stylet Placement Confirmation: ETT inserted through vocal cords under direct vision,  positive ETCO2 and breath sounds checked- equal and bilateral Tube secured with: Tape Dental Injury: Teeth and Oropharynx as per pre-operative assessment

## 2015-08-17 NOTE — Discharge Instructions (Signed)

## 2015-08-17 NOTE — Op Note (Signed)
..  08/17/2015  8:09 AM    Edwin Singh  LP:[id number]   Pre-Op Dx:  neoplasm of tonsil, right tonsil mass, odynophagia, lymphadenopathy  Post-op Dx: neoplasm of tonsil,  right tonsil mass, odynophagia, lymphadenopathy  Proc:   1)  Right Tonsillectomy > age 50  2)  Diagnostic Laryngoscopy  Surg: [name]  Anes:  General Endotracheal  EBL:  <5cc  Comp:  None  Findings:  Firm right tonsil with fibrotic scar.  No additional masses or lesions identified in the patient's oral cavity, hypopharynx, nasopharynx, larynx.  Procedure: After the patient was identified in holding and the history and physical and consent was reviewed, the patient was taken to the operating room and placed in a supine position.  General endotracheal anesthesia was induced in the normal fashion.  At this time, the patient was rotated 45 degrees and a shoulder roll was placed.  Due to the patient being edentulous, a wettened gauze was placed into the patient's oral cavity to protected his maxillary gums.  Evaluation with a Dedo laryngoscope was made for evaluation of the patient's oral cavity and airway.  This demonstrated a firm right tonsil to palpation but no other additional abnormal findings.  He also has a known right level 2 lymph node that was firm but mobile.  At this time, the Dedo laryngoscope was removed from the patient's oral cavity and attention was directed to the patient's right tonsil.  At this time, a McIvor mouthgag was inserted into the patient's oral cavity and suspended from the Hartland stand without injury to teeth, lips, or gums.  Next a red rubber catheter was inserted into the patient left nostril for retraction of the uvula and soft palate superiorly.  Next a curved Alice clamp was attached to the patient's right superior tonsillar pole and retracted medially and inferiorly.  A Bovie electrocautery was used to dissect the patient's right tonsil in a subcapsular plane.  Meticulous hemostasis  was achieved with Bovie suction cautery.  At this time, the mouth gag was released from suspension for 1 minute.  At this time, the patient's nasal cavity and oral cavity was irrigated with sterile saline.  1.8ml of 0.5% Marcaine was injected into the anterior and posterior tonsillar fossa bilaterally.  Following this  The care of patient was returned to anesthesia, awakened, and transferred to recovery in stable condition.  Dispo:  PACU to home  Plan: Soft diet.  Limit exercise and strenuous activity for 2 weeks.  Fluid hydration  Follow pathology on right tonsil mass.   [name] 8:09 AM 08/17/2015

## 2015-08-17 NOTE — Transfer of Care (Signed)
Immediate Anesthesia Transfer of Care Note  Patient: Edwin Singh  Procedure(s) Performed: Procedure(s): TONSILLECTOMY (Right)  Patient Location: PACU  Anesthesia Type:General  Level of Consciousness: sedated and responds to stimulation  Airway & Oxygen Therapy: Patient Spontanous Breathing and Patient connected to face mask oxygen  Post-op Assessment: Report given to RN and Post -op Vital signs reviewed and stable  Post vital signs: Reviewed and stable  Last Vitals:  Filed Vitals:   08/17/15 0614 08/17/15 0819  BP: 139/77 164/95  Pulse: 92 112  Temp: 36.5 C   Resp: 16 13    Last Pain: There were no vitals filed for this visit.       Complications: No apparent anesthesia complications

## 2015-08-17 NOTE — Anesthesia Postprocedure Evaluation (Signed)
Anesthesia Post Note  Patient: Edwin Singh  Procedure(s) Performed: Procedure(s) (LRB): TONSILLECTOMY (Right)  Patient location during evaluation: PACU Anesthesia Type: General Level of consciousness: awake and alert Pain management: satisfactory to patient Vital Signs Assessment: post-procedure vital signs reviewed and stable Respiratory status: nonlabored ventilation Cardiovascular status: stable Anesthetic complications: no    Last Vitals:  Filed Vitals:   08/17/15 0614 08/17/15 0819  BP: 139/77 164/95  Pulse: 92 112  Temp: 36.5 C 36.3 C  Resp: 16 13    Last Pain:  Filed Vitals:   08/17/15 0822  PainSc: Asleep                 VAN STAVEREN,Sandra Tellefsen

## 2015-08-17 NOTE — Anesthesia Preprocedure Evaluation (Signed)
Anesthesia Evaluation  Patient identified by MRN, date of birth, ID band Patient awake    Reviewed: Allergy & Precautions, NPO status , Patient's Chart, lab work & pertinent test results  Airway Mallampati: II       Dental  (+) Edentulous Upper, Edentulous Lower   Pulmonary COPD, Current Smoker,     + decreased breath sounds      Cardiovascular Exercise Tolerance: Good + Past MI   Rhythm:Regular Rate:Normal     Neuro/Psych negative neurological ROS     GI/Hepatic negative GI ROS, Neg liver ROS,   Endo/Other  negative endocrine ROS  Renal/GU negative Renal ROS     Musculoskeletal negative musculoskeletal ROS (+)   Abdominal Normal abdominal exam  (+) + scaphoid   Peds  Hematology negative hematology ROS (+)   Anesthesia Other Findings   Reproductive/Obstetrics                             Anesthesia Physical Anesthesia Plan  ASA: III  Anesthesia Plan: General   Post-op Pain Management:    Induction: Intravenous  Airway Management Planned: Oral ETT  Additional Equipment:   Intra-op Plan:   Post-operative Plan: Extubation in OR  Informed Consent: I have reviewed the patients History and Physical, chart, labs and discussed the procedure including the risks, benefits and alternatives for the proposed anesthesia with the patient or authorized representative who has indicated his/her understanding and acceptance.     Plan Discussed with: CRNA  Anesthesia Plan Comments:         Anesthesia Quick Evaluation

## 2015-08-22 LAB — SURGICAL PATHOLOGY

## 2015-08-24 ENCOUNTER — Other Ambulatory Visit: Payer: Self-pay | Admitting: Otolaryngology

## 2015-08-24 DIAGNOSIS — R599 Enlarged lymph nodes, unspecified: Secondary | ICD-10-CM

## 2015-08-25 ENCOUNTER — Other Ambulatory Visit: Payer: Self-pay | Admitting: Radiology

## 2015-08-28 ENCOUNTER — Other Ambulatory Visit: Payer: Self-pay | Admitting: Otolaryngology

## 2015-08-28 ENCOUNTER — Ambulatory Visit: Payer: MEDICAID

## 2015-08-28 ENCOUNTER — Ambulatory Visit
Admission: RE | Admit: 2015-08-28 | Discharge: 2015-08-28 | Disposition: A | Discharge status: Home / Self Care | Payer: Self-pay | Source: Ambulatory Visit | Attending: Otolaryngology | Admitting: Otolaryngology

## 2015-08-28 DIAGNOSIS — R599 Enlarged lymph nodes, unspecified: Secondary | ICD-10-CM

## 2015-08-31 ENCOUNTER — Other Ambulatory Visit (HOSPITAL_COMMUNITY): Payer: Self-pay | Admitting: Otolaryngology

## 2015-08-31 DIAGNOSIS — R591 Generalized enlarged lymph nodes: Secondary | ICD-10-CM

## 2015-09-24 ENCOUNTER — Encounter: Payer: Self-pay | Admitting: Emergency Medicine

## 2015-09-24 ENCOUNTER — Emergency Department
Admission: EM | Admit: 2015-09-24 | Discharge: 2015-09-24 | Disposition: A | Discharge status: Home / Self Care | Payer: Self-pay | Attending: Emergency Medicine | Admitting: Emergency Medicine

## 2015-09-24 ENCOUNTER — Emergency Department: Payer: Self-pay

## 2015-09-24 DIAGNOSIS — S39012A Strain of muscle, fascia and tendon of lower back, initial encounter: Secondary | ICD-10-CM | POA: Insufficient documentation

## 2015-09-24 DIAGNOSIS — Y939 Activity, unspecified: Secondary | ICD-10-CM | POA: Insufficient documentation

## 2015-09-24 DIAGNOSIS — Y999 Unspecified external cause status: Secondary | ICD-10-CM | POA: Insufficient documentation

## 2015-09-24 DIAGNOSIS — X58XXXA Exposure to other specified factors, initial encounter: Secondary | ICD-10-CM | POA: Insufficient documentation

## 2015-09-24 DIAGNOSIS — R109 Unspecified abdominal pain: Secondary | ICD-10-CM | POA: Insufficient documentation

## 2015-09-24 DIAGNOSIS — Z85818 Personal history of malignant neoplasm of other sites of lip, oral cavity, and pharynx: Secondary | ICD-10-CM | POA: Insufficient documentation

## 2015-09-24 DIAGNOSIS — Y929 Unspecified place or not applicable: Secondary | ICD-10-CM | POA: Insufficient documentation

## 2015-09-24 DIAGNOSIS — F17210 Nicotine dependence, cigarettes, uncomplicated: Secondary | ICD-10-CM | POA: Insufficient documentation

## 2015-09-24 LAB — URINALYSIS COMPLETE WITH MICROSCOPIC (ARMC ONLY)
BILIRUBIN URINE: NEGATIVE
Glucose, UA: NEGATIVE mg/dL
HGB URINE DIPSTICK: NEGATIVE
Ketones, ur: NEGATIVE mg/dL
LEUKOCYTES UA: NEGATIVE
NITRITE: NEGATIVE
PH: 5 (ref 5.0–8.0)
Protein, ur: NEGATIVE mg/dL
RBC / HPF: NONE SEEN RBC/hpf (ref 0–5)
Specific Gravity, Urine: 1.002 — ABNORMAL LOW (ref 1.005–1.030)

## 2015-09-24 MED ORDER — KETOROLAC TROMETHAMINE 60 MG/2ML IM SOLN
60.0000 mg | Freq: Once | INTRAMUSCULAR | Status: AC
  Administered 2015-09-24: 60 mg via INTRAMUSCULAR
  Filled 2015-09-24: qty 2

## 2015-09-24 MED ORDER — DIAZEPAM 5 MG/ML IJ SOLN
5.0000 mg | Freq: Once | INTRAMUSCULAR | Status: AC
  Administered 2015-09-24: 5 mg via INTRAMUSCULAR
  Filled 2015-09-24: qty 2

## 2015-09-24 MED ORDER — METHOCARBAMOL 750 MG PO TABS: 750.0000 mg | ORAL_TABLET | Freq: Four times a day (QID) | ORAL | 0 refills | Status: DC

## 2015-09-24 MED ORDER — OXYCODONE-ACETAMINOPHEN 5-325 MG PO TABS: 1.0000 | ORAL_TABLET | ORAL | 0 refills | Status: DC | PRN

## 2015-09-24 MED ORDER — MELOXICAM 15 MG PO TABS: 15.0000 mg | ORAL_TABLET | Freq: Every day | ORAL | 0 refills | Status: DC

## 2015-09-24 NOTE — ED Triage Notes (Signed)
C/o low back pain x 6 hours.  Denies injury. States he has had similar pain in the past but "never did anything about it"

## 2015-09-24 NOTE — ED Provider Notes (Signed)
[facility] Emergency Department Provider Note  ____________________________________________  Time seen: Approximately 7:00 PM  I have reviewed the triage vital signs and the nursing notes.   HISTORY  Chief Complaint Back Pain    HPI Edwin Singh is a 50 y.o. male presents for evaluation of low back pain 6 hours. Denies any injury but states he's had a similar pain in the past never followed up. Denies any burning with urination. He describes his pain as 8 out of 10 nonradiating.   Past Medical History:  Diagnosis Date  . Cancer (HCC)    throat cancer  . Chronic cough   . Myocardial infarction ([place])    10 years ago    There are no active problems to display for this patient.   Past Surgical History:  Procedure Laterality Date  . arm surgery    . NERVE SURGERY Left    arm  . TONSILLECTOMY Right 08/17/2015   Procedure: TONSILLECTOMY;  Surgeon: Carloyn Manner, MD;  Location: [facility] ORS;  Service: ENT;  Laterality: Right;    Prior to Admission medications   Medication Sig Start Date End Date Taking? Authorizing Provider  aspirin 81 MG tablet Take 81 mg by mouth daily.    Historical Provider, MD  LORazepam (ATIVAN) 1 MG tablet Take 1 tablet (1 mg total) by mouth every 8 (eight) hours as needed for anxiety. 08/08/15   Paulette Blanch, MD  meloxicam (MOBIC) 15 MG tablet Take 1 tablet (15 mg total) by mouth daily. 09/24/15   Pierce Crane Rhandi Despain, PA-C  methocarbamol (ROBAXIN) 750 MG tablet Take 1 tablet (750 mg total) by mouth 4 (four) times daily. 09/24/15   Pierce Crane Khanh Tanori, PA-C  oxyCODONE-acetaminophen (ROXICET) 5-325 MG tablet Take 1-2 tablets by mouth every 4 (four) hours as needed for severe pain. 09/24/15   Arlyss Repress, PA-C  promethazine (PHENERGAN) 12.5 MG tablet Take 1 tablet (12.5 mg total) by mouth every 6 (six) hours as needed for nausea or vomiting. 08/17/15   Carloyn Manner, MD    Allergies Penicillins  No family history on file.  Social  History Social History  Substance Use Topics  . Smoking status: Current Every Day Smoker    Packs/day: 1.50    Years: 30.00    Types: Cigarettes  . Smokeless tobacco: Never Used  . Alcohol use 12.0 oz/week    20 Cans of beer per week     Comment: 40 oz beer per day    Review of Systems Constitutional: No fever/chills Cardiovascular: Denies chest pain. Respiratory: Denies shortness of breath. Gastrointestinal: No abdominal pain.  No nausea, no vomiting.  No diarrhea.  No constipation. Genitourinary: Negative for dysuria. Musculoskeletal: Positive for low back pain. Skin: Negative for rash. Neurological: Negative for headaches, focal weakness or numbness.  10-point ROS otherwise negative.  ____________________________________________   PHYSICAL EXAM:  VITAL SIGNS: ED Triage Vitals  Enc Vitals Group     BP 09/24/15 1838 (!) 150/85     Pulse Rate 09/24/15 1838 (!) 104     Resp 09/24/15 1838 16     Temp 09/24/15 1838 98.1 F (36.7 C)     Temp Source 09/24/15 1838 Oral     SpO2 09/24/15 1838 97 %     Weight 09/24/15 1833 135 lb (61.2 kg)     Height 09/24/15 1833 5\' 9"  (1.753 m)     Head Circumference --      Peak Flow --      Pain Score  09/24/15 1833 8     Pain Loc --      Pain Edu? --      Excl. in [place]? --     Constitutional: Alert and oriented. Well appearing and in no acute distress.  Cardiovascular: Normal rate, regular rhythm. Grossly normal heart sounds.  Good peripheral circulation. Respiratory: Normal respiratory effort.  No retractions. Lungs CTAB. Gastrointestinal: Soft and nontender. No distention. No CVA tenderness. Musculoskeletal: No lower extremity tenderness nor edema.  No joint effusions. Neurologic:  Normal speech and language. No gross focal neurologic deficits are appreciated. No gait instability.Trachea leg raise negative bilaterally. Skin:  Skin is warm, dry and intact. No rash noted. Psychiatric: Mood and affect are normal. Speech and behavior  are normal.  ____________________________________________   LABS (all labs ordered are listed, but only abnormal results are displayed)  Labs Reviewed  URINALYSIS COMPLETEWITH MICROSCOPIC ([facility] ONLY) - Abnormal; Notable for the following:       Result Value   Color, Urine COLORLESS (*)    APPearance CLEAR (*)    Specific Gravity, Urine 1.002 (*)    Bacteria, UA RARE (*)    Squamous Epithelial / LPF 0-5 (*)    All other components within normal limits   ____________________________________________  EKG   ____________________________________________  RADIOLOGY  No acute osseous findings, no renal stones or calculi. Positive for fatty liver. ____________________________________________   PROCEDURES  Procedure(s) performed: None  Critical Care performed: No  ____________________________________________   INITIAL IMPRESSION / ASSESSMENT AND PLAN / ED COURSE  Pertinent labs & imaging results that were available during my care of the patient were reviewed by me and considered in my medical decision making (see chart for details). Review of the [place] CSRS was performed in accordance of the [place] prior to dispensing any controlled drugs.  Acute lumbosacral strain. Rx given for Robaxin 750 4 times a day and meloxicam 50 mg daily. Encouraged use of heat. Work excuse 24 hours.  Clinical Course    ____________________________________________   FINAL CLINICAL IMPRESSION(S) / ED DIAGNOSES  Final diagnoses:  Lumbar strain, initial encounter     This chart was dictated using voice recognition software/Dragon. Despite best efforts to proofread, errors can occur which can change the meaning. Any change was purely unintentional.    Arlyss Repress, PA-C 09/24/15 2007    Harvest Dark, MD 09/24/15 2027

## 2015-11-28 ENCOUNTER — Ambulatory Visit: Admission: RE | Admit: 2015-11-28 | Payer: Self-pay | Source: Ambulatory Visit

## 2018-04-13 ENCOUNTER — Encounter (HOSPITAL_COMMUNITY): Payer: Self-pay | Admitting: Emergency Medicine

## 2018-04-13 ENCOUNTER — Emergency Department (HOSPITAL_COMMUNITY): Payer: Self-pay

## 2018-04-13 ENCOUNTER — Other Ambulatory Visit: Payer: Self-pay

## 2018-04-13 ENCOUNTER — Emergency Department (HOSPITAL_COMMUNITY)
Admission: EM | Admit: 2018-04-13 | Discharge: 2018-04-13 | Discharge status: Against Medical Advice | Payer: Self-pay | Attending: Emergency Medicine | Admitting: Emergency Medicine

## 2018-04-13 DIAGNOSIS — Y908 Blood alcohol level of 240 mg/100 ml or more: Secondary | ICD-10-CM | POA: Insufficient documentation

## 2018-04-13 DIAGNOSIS — R531 Weakness: Secondary | ICD-10-CM | POA: Insufficient documentation

## 2018-04-13 DIAGNOSIS — Z7982 Long term (current) use of aspirin: Secondary | ICD-10-CM | POA: Insufficient documentation

## 2018-04-13 DIAGNOSIS — Z5320 Procedure and treatment not carried out because of patient's decision for unspecified reasons: Secondary | ICD-10-CM | POA: Insufficient documentation

## 2018-04-13 DIAGNOSIS — I252 Old myocardial infarction: Secondary | ICD-10-CM | POA: Insufficient documentation

## 2018-04-13 DIAGNOSIS — F17210 Nicotine dependence, cigarettes, uncomplicated: Secondary | ICD-10-CM | POA: Insufficient documentation

## 2018-04-13 DIAGNOSIS — F10129 Alcohol abuse with intoxication, unspecified: Secondary | ICD-10-CM

## 2018-04-13 DIAGNOSIS — F1010 Alcohol abuse, uncomplicated: Secondary | ICD-10-CM

## 2018-04-13 DIAGNOSIS — F10120 Alcohol abuse with intoxication, uncomplicated: Secondary | ICD-10-CM | POA: Insufficient documentation

## 2018-04-13 LAB — CBC
HCT: 48.3 % (ref 39.0–52.0)
Hemoglobin: 15.8 g/dL (ref 13.0–17.0)
MCH: 32.4 pg (ref 26.0–34.0)
MCHC: 32.7 g/dL (ref 30.0–36.0)
MCV: 99.2 fL (ref 80.0–100.0)
Platelets: 280 10*3/uL (ref 150–400)
RBC: 4.87 MIL/uL (ref 4.22–5.81)
RDW: 15.2 % (ref 11.5–15.5)
WBC: 8.1 10*3/uL (ref 4.0–10.5)
nRBC: 0 % (ref 0.0–0.2)

## 2018-04-13 LAB — URINALYSIS, ROUTINE W REFLEX MICROSCOPIC
Bilirubin Urine: NEGATIVE
Glucose, UA: NEGATIVE mg/dL
Hgb urine dipstick: NEGATIVE
Ketones, ur: NEGATIVE mg/dL
Leukocytes,Ua: NEGATIVE
Nitrite: NEGATIVE
PROTEIN: NEGATIVE mg/dL
Specific Gravity, Urine: 1.003 — ABNORMAL LOW (ref 1.005–1.030)
pH: 5 (ref 5.0–8.0)

## 2018-04-13 LAB — DIFFERENTIAL
Abs Immature Granulocytes: 0.1 10*3/uL — ABNORMAL HIGH (ref 0.00–0.07)
Band Neutrophils: 1 %
Basophils Absolute: 0.2 10*3/uL — ABNORMAL HIGH (ref 0.0–0.1)
Basophils Relative: 2 %
Eosinophils Absolute: 0.6 10*3/uL — ABNORMAL HIGH (ref 0.0–0.5)
Eosinophils Relative: 8 %
Lymphocytes Relative: 50 %
Lymphs Abs: 4.1 10*3/uL — ABNORMAL HIGH (ref 0.7–4.0)
Monocytes Absolute: 0.4 10*3/uL (ref 0.1–1.0)
Monocytes Relative: 5 %
Neutro Abs: 2.8 10*3/uL (ref 1.7–7.7)
Neutrophils Relative %: 33 %
Promyelocytes Relative: 1 %
nRBC: 0 /100 WBC

## 2018-04-13 LAB — RAPID URINE DRUG SCREEN, HOSP PERFORMED
Amphetamines: NOT DETECTED
Barbiturates: NOT DETECTED
Benzodiazepines: NOT DETECTED
Cocaine: NOT DETECTED
Opiates: NOT DETECTED
Tetrahydrocannabinol: NOT DETECTED

## 2018-04-13 LAB — COMPREHENSIVE METABOLIC PANEL
ALT: 10 U/L (ref 0–44)
AST: 19 U/L (ref 15–41)
Albumin: 4 g/dL (ref 3.5–5.0)
Alkaline Phosphatase: 59 U/L (ref 38–126)
Anion gap: 11 (ref 5–15)
BUN: 11 mg/dL (ref 6–20)
CO2: 21 mmol/L — AB (ref 22–32)
Calcium: 9.2 mg/dL (ref 8.9–10.3)
Chloride: 107 mmol/L (ref 98–111)
Creatinine, Ser: 0.9 mg/dL (ref 0.61–1.24)
GFR calc non Af Amer: >60 mL/min (ref >60)
Glucose, Bld: 109 mg/dL — ABNORMAL HIGH (ref 70–99)
Potassium: 3.5 mmol/L (ref 3.5–5.1)
SODIUM: 139 mmol/L (ref 135–145)
Total Bilirubin: 0.5 mg/dL (ref 0.3–1.2)
Total Protein: 7.6 g/dL (ref 6.5–8.1)

## 2018-04-13 LAB — I-STAT CREATININE, ED: Creatinine, Ser: 1.2 mg/dL (ref 0.61–1.24)

## 2018-04-13 LAB — APTT: aPTT: 31 seconds (ref 24–36)

## 2018-04-13 LAB — CBG MONITORING, ED: Glucose-Capillary: 110 mg/dL — ABNORMAL HIGH (ref 70–99)

## 2018-04-13 LAB — ETHANOL: Alcohol, Ethyl (B): 304 mg/dL (ref <10)

## 2018-04-13 LAB — PROTIME-INR
INR: 1 (ref 0.8–1.2)
Prothrombin Time: 12.7 seconds (ref 11.4–15.2)

## 2018-04-13 LAB — I-STAT TROPONIN, ED: Troponin i, poc: 0 ng/mL (ref 0.00–0.08)

## 2018-04-13 MED ORDER — IOHEXOL 350 MG/ML SOLN
75.0000 mL | Freq: Once | INTRAVENOUS | Status: AC | PRN
  Administered 2018-04-13: 75 mL via INTRAVENOUS

## 2018-04-13 MED ORDER — LORAZEPAM 2 MG/ML IJ SOLN
1.0000 mg | Freq: Once | INTRAMUSCULAR | Status: AC
  Administered 2018-04-13: 1 mg via INTRAVENOUS

## 2018-04-13 MED ORDER — LORAZEPAM 2 MG/ML IJ SOLN
INTRAMUSCULAR | Status: AC
  Filled 2018-04-13: qty 1

## 2018-04-13 NOTE — ED Triage Notes (Signed)
Patient came through front door complaining of left shoulder pain and numbness, with cramping in left arm.  He is having numbness in left leg and not able to move left leg.  He does have weakness in left arm.  No facial droop, no gaze, slow to answer questions.  Patient admits to 2 40oz beers today.  Patient is CAOx4, LSN at 1715.

## 2018-04-13 NOTE — Consult Note (Signed)
Neurology Consultation  Reason for Consult: Acute code stroke for left-sided weakness Referring Physician: Dr. Lennice Sites  CC: Left-sided weakness  History is obtained from: Patient, chart  HPI: Edwin Singh is a 53 y.o. male who has a past medical history of alcohol abuse, throat cancer, coronary artery disease, presents to the emergency room when he started noticing left-sided numbness and weakness that started this evening around 5:15 PM.  His last known normal was 5 PM today 04/13/2018, when he was walking back from his neighbor's yard to his house and noticed that he is unable to control the left side of his body and could not move it.  His wife got concerned since these symptoms were new and brought him to the hospital.  He was evaluated by the ED provider and a code stroke was activated since he was within the time window for intervention. Patient admits to having consumed 2x 40 ounce beers today. He also reports baseline left arm pain from a prior surgery on his elbow but says that the weakness is new. Denies any chest pain shortness of breath.  Denies nausea vomiting.  Denies visual changes.  Denies any facial muscle weakness.  Denies slurred speech.  Denies word finding difficulty.   LKW: 5 PM April 13, 2018 tpa given?: no, low NIH, unclear history Premorbid modified Rankin scale (mRS): 0  ROS  ROS was performed and is negative except as noted in the HPI.   Past Medical History:  Diagnosis Date  . Cancer (HCC)    throat cancer  . Chronic cough   . Myocardial infarction    10 years ago   Family history Denies any family history of strokes.  Has family history of heart disease.  Social History:   reports that he has been smoking cigarettes. He has a 45.00 pack-year smoking history. He has never used smokeless tobacco. He reports current alcohol use of about 20.0 standard drinks of alcohol per week. He reports current drug use. Drug: Marijuana. Positive for tobacco,  marijuana and alcohol. Medications No current facility-administered medications for this encounter.   Current Outpatient Medications:  .  aspirin 81 MG tablet, Take 81 mg by mouth daily., Disp: , Rfl:  .  LORazepam (ATIVAN) 1 MG tablet, Take 1 tablet (1 mg total) by mouth every 8 (eight) hours as needed for anxiety., Disp: 15 tablet, Rfl: 0 .  meloxicam (MOBIC) 15 MG tablet, Take 1 tablet (15 mg total) by mouth daily., Disp: 30 tablet, Rfl: 0 .  methocarbamol (ROBAXIN) 750 MG tablet, Take 1 tablet (750 mg total) by mouth 4 (four) times daily., Disp: 40 tablet, Rfl: 0 .  oxyCODONE-acetaminophen (ROXICET) 5-325 MG tablet, Take 1-2 tablets by mouth every 4 (four) hours as needed for severe pain., Disp: 15 tablet, Rfl: 0 .  promethazine (PHENERGAN) 12.5 MG tablet, Take 1 tablet (12.5 mg total) by mouth every 6 (six) hours as needed for nausea or vomiting., Disp: 30 tablet, Rfl: 0  Exam: Current vital signs: There were no vitals taken for this visit. Vital signs in last 24 hours:    GENERAL: Awake, alert in NAD HEENT: - Normocephalic and atraumatic, dry mm, no LN++, no Thyromegally LUNGS - Clear to auscultation bilaterally with no wheezes CV - S1S2 RRR, no m/r/g, equal pulses bilaterally. ABDOMEN - Soft, nontender, nondistended with normoactive BS Ext: warm, well perfused, intact peripheral pulses, no edema  NEURO:  Mental Status: AA&Ox3  Language: speech is mildly dysarthric.  Naming, repetition, fluency, and comprehension  intact.  Slow to respond to questions Cranial Nerves: PERRL . EOMI, visual fields full, no facial asymmetry, facial sensation intact, hearing intact, tongue/uvula/soft palate midline, normal sternocleidomastoid and trapezius muscle strength. No evidence of tongue atrophy or fibrillations Motor: Was unable to raise his left arm about arm passively raising his arm up on the left side, complained of excessive pain.  Was later able to lift his arm up and show [place] where his  surgery was done.  No drift in the left arm.  Was unable to lift the left leg antigravity-2/5. Right upper and lower extremity are 5/5 with no drift. Tone: is normal and bulk is normal Sensation-inconsistent exam.  Initially said no difference in light touch to both sides but says that he is not able to feel his left side on repeat exam followed by again being able to feel normally. Coordination: FTN intact bilaterally Gait- deferred  NIHSS -3   Labs I have reviewed labs in epic and the results pertinent to this consultation are:  CBC    Component Value Date/Time   WBC 8.1 04/13/2018 1936   RBC 4.87 04/13/2018 1936   HGB 15.8 04/13/2018 1936   HCT 48.3 04/13/2018 1936   PLT 280 04/13/2018 1936   MCV 99.2 04/13/2018 1936   MCH 32.4 04/13/2018 1936   MCHC 32.7 04/13/2018 1936   RDW 15.2 04/13/2018 1936   LYMPHSABS 3.3 08/08/2015 0019   MONOABS 0.4 08/08/2015 0019   EOSABS 0.3 08/08/2015 0019   BASOSABS 0.1 08/08/2015 0019    CMP     Component Value Date/Time   NA 136 08/08/2015 0019   K 3.7 08/08/2015 0019   CL 101 08/08/2015 0019   CO2 25 08/08/2015 0019   GLUCOSE 95 08/08/2015 0019   BUN 9 08/08/2015 0019   CREATININE 1.20 04/13/2018 1952   CALCIUM 9.0 08/08/2015 0019   PROT 8.2 (H) 08/08/2015 0019   ALBUMIN 4.3 08/08/2015 0019   AST 59 (H) 08/08/2015 0019   ALT 31 08/08/2015 0019   ALKPHOS 54 08/08/2015 0019   BILITOT 0.4 08/08/2015 0019   GFRNONAA >60 08/08/2015 0019   GFRAA >60 08/08/2015 0019   Imaging I have reviewed the images obtained: CT-scan of the brain-no acute changes.  Aspects 10.  No bleed CT angiogram head and neck preliminary negative for LVO.  Formal read pending.  Assessment: 53 year old man with above said past medical history presenting with acute onset of left-sided weakness. His exam was inconsistent.  He did have some dysarthria but also reported consuming a couple of 40 ounces beers prior to presentation. He has pain on his left arm  that limits movement somewhat is able to hold the left arm antigravity.  Left leg, was effort dependent weakness.  Sensory exam was inconsistent. No TPA due to low stroke scale and inconsistent exam. I obtained a CTA head and neck which on pulmonary review does not show any evidence of LVO making him not a candidate for any endovascular thrombectomy.  Impression Left-sided weakness- evaluate for stroke Inconsistent examination making conversion disorder on the list of differentials. Baseline left arm pain and weakness Possible alcohol intoxication  Recommendations: Stat MRI brain without contrast. Check urinary toxicology screen and blood alcohol levels The MRI of the brain is negative for acute stroke, no further neurological intervention inpatient is required at this time. I will follow with you after the MRI result becomes available. -- Amie Portland, MD [facility] Pager: [phone] If 7pm to 7am, please call on  call as listed on AMION.   ADDENDUM I have reviewed the MRI of the brain.  MRI brain obtained after much difficulty due to patient cooperation and giving IV Ativan- no stroke.  Normal. Blood alcohol level elevated at 304.  Urinary toxicology screen negative. Patient wanting to leave AMA.  I spoke with the patient's RN and told her that the MRI brain was negative for any acute stroke.  No new neurological recommendations after reviewing the MRI. Further management per ED provider. Please call neurology with questions  -- Amie Portland, MD [facility] Pager: [phone] If 7pm to 7am, please call on call as listed on AMION.

## 2018-04-13 NOTE — ED Notes (Signed)
Pt w/ reports L leg and L arm numbness onset 1715. States he was walking and his L leg "just gave out". Pt w/ unequal grip strength R>L, L arm and leg drift. No aphasia, no facial droop. GCS 15. Pt brought to bridge and activated as code stroke. MD Damita Dunnings to pt bedside to assess.

## 2018-04-13 NOTE — ED Provider Notes (Signed)
[place] EMERGENCY DEPARTMENT Provider Note   CSN: [id number] Arrival date & time: 04/13/18  1919    History   Chief Complaint Chief Complaint  Patient presents with  . Code Stroke    HPI Edwin Singh is a 53 y.o. male.     The history is provided by the patient.  Neurologic Problem  This is a new problem. The current episode started 3 to 5 hours ago. The problem occurs constantly. The problem has not changed since onset.Pertinent negatives include no chest pain, no abdominal pain, no headaches and no shortness of breath. Associated symptoms comments: Left arm and leg weakness and numbness, start at 515 pm, hx of LUE surgery, admits to ETOH use. Nothing aggravates the symptoms. Nothing relieves the symptoms. He has tried nothing for the symptoms. The treatment provided no relief.    Past Medical History:  Diagnosis Date  . Cancer (HCC)    throat cancer  . Chronic cough   . 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   Past Surgical History:  Procedure Laterality Date  . arm surgery    . NERVE SURGERY Left    arm  . TONSILLECTOMY Right 08/17/2015   Procedure: TONSILLECTOMY;  Surgeon: Carloyn Manner, MD;  Location: [facility] ORS;  Service: ENT;  Laterality: Right;        Home Medications    Prior to Admission medications   Medication Sig Start Date End Date Taking? Authorizing Provider  aspirin 81 MG tablet Take 81 mg by mouth daily.    [provider]  LORazepam (ATIVAN) 1 MG tablet Take 1 tablet (1 mg total) by mouth every 8 (eight) hours as needed for anxiety. 08/08/15   Paulette Blanch, MD  meloxicam (MOBIC) 15 MG tablet Take 1 tablet (15 mg total) by mouth daily. 09/24/15   Beers, Pierce Crane, PA-C  methocarbamol (ROBAXIN) 750 MG tablet Take 1 tablet (750 mg total) by mouth 4 (four) times daily. 09/24/15   Beers, Pierce Crane, PA-C  oxyCODONE-acetaminophen (ROXICET) 5-325 MG tablet  Take 1-2 tablets by mouth every 4 (four) hours as needed for severe pain. 09/24/15   Beers, Pierce Crane, PA-C  promethazine (PHENERGAN) 12.5 MG tablet Take 1 tablet (12.5 mg total) by mouth every 6 (six) hours as needed for nausea or vomiting. 08/17/15   Carloyn Manner, MD    Family History History reviewed. No pertinent family history.  Social History Social History   Tobacco Use  . Smoking status: Current Every Day Smoker    Packs/day: 1.50    Years: 30.00    Pack years: 45.00    Types: Cigarettes  . Smokeless tobacco: Never Used  Substance Use Topics  . Alcohol use: Yes    Alcohol/week: 20.0 standard drinks    Types: 20 Cans of beer per week    Comment: 40 oz beer per day  . Drug use: Yes    Types: Marijuana     Allergies   Penicillins   Review of Systems Review of Systems  Constitutional: Negative for chills and fever.  HENT: Negative for ear pain and sore throat.   Eyes: Negative for pain and visual disturbance.  Respiratory: Negative for cough and shortness of breath.   Cardiovascular: Negative for chest pain and palpitations.  Gastrointestinal: Negative for abdominal pain and vomiting.  Genitourinary: Negative for dysuria and hematuria.  Musculoskeletal: Positive for gait problem. Negative for  arthralgias and back pain.  Skin: Negative for color change and rash.  Neurological: Positive for weakness and numbness. Negative for dizziness, tremors, seizures, syncope, facial asymmetry, speech difficulty, light-headedness and headaches.  All other systems reviewed and are negative.    Physical Exam Updated Vital Signs  ED Triage Vitals  Enc Vitals Group     BP 04/13/18 2114 (!) 129/97     Pulse Rate 04/13/18 2114 (!) 113     Resp 04/13/18 2114 15     Temp 04/13/18 2114 97.8 F (36.6 C)     Temp Source 04/13/18 2114 Oral     SpO2 04/13/18 2114 98 %     Weight 04/13/18 2012 135 lb (61.2 kg)     Height --      Head Circumference --      Peak Flow --       Pain Score 04/13/18 2011 9     Pain Loc --      Pain Edu? --      Excl. in [place]? --      Physical Exam Vitals signs and nursing note reviewed.  Constitutional:      Appearance: He is well-developed.  HENT:     Head: Normocephalic and atraumatic.     Nose: Nose normal.     Mouth/Throat:     Mouth: Mucous membranes are moist.  Eyes:     Extraocular Movements: Extraocular movements intact.     Conjunctiva/sclera: Conjunctivae normal.     Pupils: Pupils are equal, round, and reactive to light.  Neck:     Musculoskeletal: Normal range of motion and neck supple. No muscular tenderness.  Cardiovascular:     Rate and Rhythm: Normal rate and regular rhythm.     Pulses: Normal pulses.     Heart sounds: Normal heart sounds. No murmur.  Pulmonary:     Effort: Pulmonary effort is normal. No respiratory distress.     Breath sounds: Normal breath sounds.  Abdominal:     Palpations: Abdomen is soft.     Tenderness: There is no abdominal tenderness.  Musculoskeletal: Normal range of motion.  Skin:    General: Skin is warm and dry.     Capillary Refill: Capillary refill takes less than 2 seconds.  Neurological:     Mental Status: He is alert and oriented to person, place, and time.     Comments: Patient with 5+ out of 5 strength in his right upper and right lower extremity, 4+ out of 5 strength in the left upper extremity, 1+ out of 5 strength in left lower extremity which appears effort dependent, no drift, normal finger-to-nose finger, slightly slurred speech, no facial droop, cranial nerves appear grossly intact, normal sensation throughout  Psychiatric:        Mood and Affect: Mood normal.      ED Treatments / Results  Labs (all labs ordered are listed, but only abnormal results are displayed) Labs Reviewed  ETHANOL - Abnormal; Notable for the following components:      Result Value   Alcohol, Ethyl (B) 304 (*)    All other components within normal limits  DIFFERENTIAL -  Abnormal; Notable for the following components:   Lymphs Abs 4.1 (*)    Eosinophils Absolute 0.6 (*)    Basophils Absolute 0.2 (*)    Abs Immature Granulocytes 0.10 (*)    All other components within normal limits  COMPREHENSIVE METABOLIC PANEL - Abnormal; Notable for the following components:   CO2 21 (*)  Glucose, Bld 109 (*)    All other components within normal limits  URINALYSIS, ROUTINE W REFLEX MICROSCOPIC - Abnormal; Notable for the following components:   Color, Urine STRAW (*)    Specific Gravity, Urine 1.003 (*)    All other components within normal limits  CBG MONITORING, ED - Abnormal; Notable for the following components:   Glucose-Capillary 110 (*)    All other components within normal limits  PROTIME-INR  APTT  CBC  RAPID URINE DRUG SCREEN, HOSP PERFORMED  I-STAT CREATININE, ED  I-STAT TROPONIN, ED    EKG EKG Interpretation  Date/Time:  Monday April 13 2018 21:09:54 EDT Ventricular Rate:  100 PR Interval:    QRS Duration: 93 QT Interval:  353 QTC Calculation: 456 R Axis:   82 Text Interpretation:  Sinus tachycardia Confirmed by Lennice Sites [id number]) on 04/14/2018 12:16:34 AM   Radiology Ct Angio Head W Or Wo Contrast  Result Date: 04/13/2018 CLINICAL DATA:  Left-sided weakness EXAM: CT ANGIOGRAPHY HEAD AND NECK TECHNIQUE: Multidetector CT imaging of the head and neck was performed using the standard protocol during bolus administration of intravenous contrast. Multiplanar CT image reconstructions and MIPs were obtained to evaluate the vascular anatomy. Carotid stenosis measurements (when applicable) are obtained utilizing NASCET criteria, using the distal internal carotid diameter as the denominator. CONTRAST:  51mL OMNIPAQUE IOHEXOL 350 MG/ML SOLN COMPARISON:  None. Head CT 04/13/2018 FINDINGS: CTA NECK FINDINGS SKELETON: There is no bony spinal canal stenosis. No lytic or blastic lesion. OTHER NECK: Normal pharynx, larynx and major salivary glands. No  cervical lymphadenopathy. Unremarkable thyroid gland. UPPER CHEST: No pneumothorax or pleural effusion. No nodules or masses. AORTIC ARCH: There is no calcific atherosclerosis of the aortic arch. There is no aneurysm, dissection or hemodynamically significant stenosis of the visualized ascending aorta and aortic arch. Conventional 3 vessel aortic branching pattern. The visualized proximal subclavian arteries are widely patent. RIGHT CAROTID SYSTEM: --Common carotid artery: Widely patent origin without common carotid artery dissection or aneurysm. --Internal carotid artery: Normal without aneurysm, dissection or stenosis. --External carotid artery: No acute abnormality. LEFT CAROTID SYSTEM: --Common carotid artery: Widely patent origin without common carotid artery dissection or aneurysm. --Internal carotid artery: Normal without aneurysm, dissection or stenosis. --External carotid artery: No acute abnormality. VERTEBRAL ARTERIES: Left dominant configuration. Both origins are normal. No dissection, occlusion or flow-limiting stenosis to the vertebrobasilar confluence. CTA HEAD FINDINGS POSTERIOR CIRCULATION: --Vertebral arteries: Normal codominant configuration of V4 segments. --Posterior inferior cerebellar arteries (PICA): Patent origins from the vertebral arteries. --Anterior inferior cerebellar arteries (AICA): Patent origins from the basilar artery. --Basilar artery: Normal. --Superior cerebellar arteries: Normal. --Posterior cerebral arteries (PCA): Normal. Both originate from the basilar artery. Posterior communicating arteries (p-comm) are diminutive. ANTERIOR CIRCULATION: --Intracranial internal carotid arteries: Normal. --Anterior cerebral arteries (ACA): Normal. Both A1 segments are present. Patent anterior communicating artery (a-comm). --Middle cerebral arteries (MCA): Normal. VENOUS SINUSES: As permitted by contrast timing, patent. ANATOMIC VARIANTS: None DELAYED PHASE: Not performed. Review of the MIP  images confirms the above findings. IMPRESSION: Normal CTA of the head and neck. Electronically Signed   By: Ulyses Jarred M.D.   On: 04/13/2018 20:04   Ct Angio Neck W Or Wo Contrast  Result Date: 04/13/2018 CLINICAL DATA:  Left-sided weakness EXAM: CT ANGIOGRAPHY HEAD AND NECK TECHNIQUE: Multidetector CT imaging of the head and neck was performed using the standard protocol during bolus administration of intravenous contrast. Multiplanar CT image reconstructions and MIPs were obtained to evaluate the vascular anatomy. Carotid stenosis  measurements (when applicable) are obtained utilizing NASCET criteria, using the distal internal carotid diameter as the denominator. CONTRAST:  23mL OMNIPAQUE IOHEXOL 350 MG/ML SOLN COMPARISON:  None. Head CT 04/13/2018 FINDINGS: CTA NECK FINDINGS SKELETON: There is no bony spinal canal stenosis. No lytic or blastic lesion. OTHER NECK: Normal pharynx, larynx and major salivary glands. No cervical lymphadenopathy. Unremarkable thyroid gland. UPPER CHEST: No pneumothorax or pleural effusion. No nodules or masses. AORTIC ARCH: There is no calcific atherosclerosis of the aortic arch. There is no aneurysm, dissection or hemodynamically significant stenosis of the visualized ascending aorta and aortic arch. Conventional 3 vessel aortic branching pattern. The visualized proximal subclavian arteries are widely patent. RIGHT CAROTID SYSTEM: --Common carotid artery: Widely patent origin without common carotid artery dissection or aneurysm. --Internal carotid artery: Normal without aneurysm, dissection or stenosis. --External carotid artery: No acute abnormality. LEFT CAROTID SYSTEM: --Common carotid artery: Widely patent origin without common carotid artery dissection or aneurysm. --Internal carotid artery: Normal without aneurysm, dissection or stenosis. --External carotid artery: No acute abnormality. VERTEBRAL ARTERIES: Left dominant configuration. Both origins are normal. No  dissection, occlusion or flow-limiting stenosis to the vertebrobasilar confluence. CTA HEAD FINDINGS POSTERIOR CIRCULATION: --Vertebral arteries: Normal codominant configuration of V4 segments. --Posterior inferior cerebellar arteries (PICA): Patent origins from the vertebral arteries. --Anterior inferior cerebellar arteries (AICA): Patent origins from the basilar artery. --Basilar artery: Normal. --Superior cerebellar arteries: Normal. --Posterior cerebral arteries (PCA): Normal. Both originate from the basilar artery. Posterior communicating arteries (p-comm) are diminutive. ANTERIOR CIRCULATION: --Intracranial internal carotid arteries: Normal. --Anterior cerebral arteries (ACA): Normal. Both A1 segments are present. Patent anterior communicating artery (a-comm). --Middle cerebral arteries (MCA): Normal. VENOUS SINUSES: As permitted by contrast timing, patent. ANATOMIC VARIANTS: None DELAYED PHASE: Not performed. Review of the MIP images confirms the above findings. IMPRESSION: Normal CTA of the head and neck. Electronically Signed   By: Ulyses Jarred M.D.   On: 04/13/2018 20:04   Mr Brain Wo Contrast  Result Date: 04/13/2018 CLINICAL DATA:  Left leg and arm numbness EXAM: MRI HEAD WITHOUT CONTRAST TECHNIQUE: Multiplanar, multiecho pulse sequences of the brain and surrounding structures were obtained without intravenous contrast. COMPARISON:  CTA head neck 04/13/2018 FINDINGS: BRAIN: There is no acute infarct, acute hemorrhage, hydrocephalus or extra-axial collection. The midline structures are normal. No midline shift or other mass effect. The white matter signal is normal for the patient's age. The cerebral and cerebellar volume are age-appropriate. Susceptibility-sensitive sequences show no chronic microhemorrhage or superficial siderosis. VASCULAR: Major intracranial arterial and venous sinus flow voids are normal. SKULL AND UPPER CERVICAL SPINE: Calvarial bone marrow signal is normal. There is no skull  base mass. Visualized upper cervical spine and soft tissues are normal. SINUSES/ORBITS: No fluid levels or advanced mucosal thickening. No mastoid or middle ear effusion. The orbits are normal. IMPRESSION: Normal brain. Electronically Signed   By: Ulyses Jarred M.D.   On: 04/13/2018 21:12   Ct Head Code Stroke Wo Contrast  Result Date: 04/13/2018 CLINICAL DATA:  Code stroke.  53 y/o  M; left-sided weakness. EXAM: CT HEAD WITHOUT CONTRAST TECHNIQUE: Contiguous axial images were obtained from the base of the skull through the vertex without intravenous contrast. COMPARISON:  09/06/2009 CT head. FINDINGS: Brain: No evidence of acute infarction, hemorrhage, hydrocephalus, extra-axial collection or mass lesion/mass effect. Vascular: No hyperdense vessel or unexpected calcification. Skull: Normal. Negative for fracture or focal lesion. Sinuses/Orbits: No acute finding. Other: None. ASPECTS [facility] Stroke Program Early CT Score) - Ganglionic level infarction (caudate, lentiform  nuclei, internal capsule, insula, M1-M3 cortex): 7 - Supraganglionic infarction (M4-M6 cortex): 3 Total score (0-10 with 10 being normal): 10 IMPRESSION: 1. No acute intracranial abnormality identified. Unremarkable CT of the head. 2. ASPECTS is 10. These results were communicated to Dr. Rory Percy at 7:42 pmon 3/9/2020by text page via the [facility] messaging system. Electronically Signed   By: Kristine Garbe M.D.   On: 04/13/2018 19:43    Procedures Procedures (including critical care time)  Medications Ordered in ED Medications  LORazepam (ATIVAN) 2 MG/ML injection (  Canceled Entry 04/13/18 2033)  iohexol (OMNIPAQUE) 350 MG/ML injection 75 mL (75 mLs Intravenous Contrast Given 04/13/18 1953)  LORazepam (ATIVAN) injection 1 mg (1 mg Intravenous Given 04/13/18 2030)     Initial Impression / Assessment and Plan / ED Course  I have reviewed the triage vital signs and the nursing notes.  Pertinent labs & imaging results that were  available during my care of the patient were reviewed by me and considered in my medical decision making (see chart for details).        Edwin Singh is a 53 year old male with history of MI who presents to the ED with left-sided weakness and numbness.  Patient with overall unremarkable vitals.  No fever.  Symptoms began about 2 hours prior to arrival.  Patient has some left upper extremity, left lower extremity weakness but appears effort dependent.  No obvious cranial nerve involvement.  No numbness.  No drift.  No visual field deficit.  Patient was a code stroke upon arrival.  CT scan showed no acute findings.  Given unremarkable neurological exam and NIH score patient was not given TPA.  He does admit to alcohol use and suspect that alcohol use is because of symptoms today.  Has chronic left upper arm pain from previous surgery.  MRI was obtained that was unremarkable.  No acute stroke.  Otherwise lab work showed no significant anemia, electrolyte abnormality, kidney injury.  Alcohol level elevated.  Patient left AGAINST MEDICAL ADVICE prior to knowing MRI results.  However at this time I believe that patient likely here secondary to alcohol abuse and chronic left upper arm pain.  Patient was able to ambulate without any issues prior to leaving [place].  Clinically patient was sober throughout my care.    This chart was dictated using voice recognition software.  Despite best efforts to proofread,  errors can occur which can change the documentation meaning.    Final Clinical Impressions(s) / ED Diagnoses   Final diagnoses:  Alcohol abuse    ED Discharge Orders    None       Lennice Sites, DO 04/14/18 0019

## 2018-04-13 NOTE — ED Notes (Signed)
Patient is getting dressed at this time and wanting to leave.  Dr Ronnald Nian and Dr Rory Percy notified.

## 2018-04-13 NOTE — ED Notes (Signed)
Patient uncooperative in MRI.  Patient is feeling claustrophobic.  Dr Rory Percy notified.  New orders per MD.

## 2018-04-13 NOTE — ED Notes (Addendum)
Patient is dressed and walking in room.  Patient is verbal aggressive with staff and threatening to this RN.  IV taken out by another RN.

## 2018-04-13 NOTE — ED Notes (Signed)
Spoke with Dr Rory Percy and he states that everything is fine with MRI, let Dr Ronnald Nian know about patient wanting to leave.

## 2018-04-14 NOTE — ED Notes (Signed)
Wasted 1mg  Ativan with Daralene Milch, RN.

## 2018-05-18 ENCOUNTER — Emergency Department (HOSPITAL_COMMUNITY): Payer: Self-pay

## 2018-05-18 ENCOUNTER — Emergency Department (HOSPITAL_COMMUNITY)
Admission: EM | Admit: 2018-05-18 | Discharge: 2018-05-18 | Disposition: A | Discharge status: Home / Self Care | Payer: Self-pay | Attending: Emergency Medicine | Admitting: Emergency Medicine

## 2018-05-18 ENCOUNTER — Other Ambulatory Visit: Payer: Self-pay

## 2018-05-18 DIAGNOSIS — C140 Malignant neoplasm of pharynx, unspecified: Secondary | ICD-10-CM | POA: Insufficient documentation

## 2018-05-18 DIAGNOSIS — I2693 Single subsegmental pulmonary embolism without acute cor pulmonale: Secondary | ICD-10-CM | POA: Insufficient documentation

## 2018-05-18 DIAGNOSIS — F17210 Nicotine dependence, cigarettes, uncomplicated: Secondary | ICD-10-CM | POA: Insufficient documentation

## 2018-05-18 LAB — CBC WITH DIFFERENTIAL/PLATELET
Abs Immature Granulocytes: 0.04 10*3/uL (ref 0.00–0.07)
Basophils Absolute: 0.1 10*3/uL (ref 0.0–0.1)
Basophils Relative: 1 %
Eosinophils Absolute: 0.1 10*3/uL (ref 0.0–0.5)
Eosinophils Relative: 2 %
HCT: 44.6 % (ref 39.0–52.0)
Hemoglobin: 15.3 g/dL (ref 13.0–17.0)
Immature Granulocytes: 1 %
Lymphocytes Relative: 19 %
Lymphs Abs: 1.6 10*3/uL (ref 0.7–4.0)
MCH: 34.5 pg — ABNORMAL HIGH (ref 26.0–34.0)
MCHC: 34.3 g/dL (ref 30.0–36.0)
MCV: 100.5 fL — ABNORMAL HIGH (ref 80.0–100.0)
Monocytes Absolute: 1.1 10*3/uL — ABNORMAL HIGH (ref 0.1–1.0)
Monocytes Relative: 14 %
Neutro Abs: 5.1 10*3/uL (ref 1.7–7.7)
Neutrophils Relative %: 63 %
Platelets: 170 10*3/uL (ref 150–400)
RBC: 4.44 MIL/uL (ref 4.22–5.81)
RDW: 14.1 % (ref 11.5–15.5)
WBC: 8 10*3/uL (ref 4.0–10.5)
nRBC: 0 % (ref 0.0–0.2)

## 2018-05-18 LAB — URINALYSIS, ROUTINE W REFLEX MICROSCOPIC
Bilirubin Urine: NEGATIVE
Glucose, UA: NEGATIVE mg/dL
Ketones, ur: 20 mg/dL — AB
Leukocytes,Ua: NEGATIVE
Nitrite: NEGATIVE
Protein, ur: 100 mg/dL — AB
Specific Gravity, Urine: 1.025 (ref 1.005–1.030)
pH: 6 (ref 5.0–8.0)

## 2018-05-18 LAB — COMPREHENSIVE METABOLIC PANEL
ALT: 14 U/L (ref 0–44)
AST: 20 U/L (ref 15–41)
Albumin: 3.5 g/dL (ref 3.5–5.0)
Alkaline Phosphatase: 55 U/L (ref 38–126)
Anion gap: 12 (ref 5–15)
BUN: 9 mg/dL (ref 6–20)
CO2: 23 mmol/L (ref 22–32)
Calcium: 9 mg/dL (ref 8.9–10.3)
Chloride: 95 mmol/L — ABNORMAL LOW (ref 98–111)
Creatinine, Ser: 0.86 mg/dL (ref 0.61–1.24)
GFR calc Af Amer: >60 mL/min (ref >60)
GFR calc non Af Amer: >60 mL/min (ref >60)
Glucose, Bld: 104 mg/dL — ABNORMAL HIGH (ref 70–99)
Potassium: 3.5 mmol/L (ref 3.5–5.1)
Sodium: 130 mmol/L — ABNORMAL LOW (ref 135–145)
Total Bilirubin: 0.6 mg/dL (ref 0.3–1.2)
Total Protein: 7.6 g/dL (ref 6.5–8.1)

## 2018-05-18 LAB — LACTIC ACID, PLASMA: Lactic Acid, Venous: 0.7 mmol/L (ref 0.5–1.9)

## 2018-05-18 LAB — D-DIMER, QUANTITATIVE: D-Dimer, Quant: 4.3 ug/mL-FEU — ABNORMAL HIGH (ref 0.00–0.50)

## 2018-05-18 MED ORDER — HYDROCODONE-ACETAMINOPHEN 5-325 MG PO TABS
1.0000 | ORAL_TABLET | Freq: Once | ORAL | Status: AC
  Administered 2018-05-18: 15:00:00 1 via ORAL
  Filled 2018-05-18: qty 1

## 2018-05-18 MED ORDER — RIVAROXABAN 15 MG PO TABS
15.0000 mg | ORAL_TABLET | Freq: Once | ORAL | Status: AC
  Administered 2018-05-18: 21:00:00 15 mg via ORAL
  Filled 2018-05-18: qty 1

## 2018-05-18 MED ORDER — SODIUM CHLORIDE 0.9 % IV BOLUS
1000.0000 mL | Freq: Once | INTRAVENOUS | Status: AC
  Administered 2018-05-18: 1000 mL via INTRAVENOUS

## 2018-05-18 MED ORDER — RIVAROXABAN (XARELTO) EDUCATION KIT FOR DVT/PE PATIENTS
PACK | Freq: Once | Status: AC
  Administered 2018-05-18: 21:00:00

## 2018-05-18 MED ORDER — RIVAROXABAN (XARELTO) VTE STARTER PACK (15 & 20 MG): ORAL_TABLET | ORAL | 0 refills | Status: AC

## 2018-05-18 MED ORDER — LEVOFLOXACIN 750 MG PO TABS
750.0000 mg | ORAL_TABLET | Freq: Once | ORAL | Status: AC
  Administered 2018-05-18: 17:00:00 750 mg via ORAL
  Filled 2018-05-18: qty 1

## 2018-05-18 MED ORDER — METRONIDAZOLE 500 MG PO TABS
500.0000 mg | ORAL_TABLET | Freq: Once | ORAL | Status: AC
  Administered 2018-05-18: 500 mg via ORAL
  Filled 2018-05-18: qty 1

## 2018-05-18 MED ORDER — SODIUM CHLORIDE (PF) 0.9 % IJ SOLN
INTRAMUSCULAR | Status: AC
  Filled 2018-05-18: qty 50

## 2018-05-18 MED ORDER — HYDROCODONE-ACETAMINOPHEN 5-325 MG PO TABS: 1.0000 | ORAL_TABLET | Freq: Four times a day (QID) | ORAL | 0 refills | Status: AC | PRN

## 2018-05-18 MED ORDER — HYDROCODONE-ACETAMINOPHEN 5-325 MG PO TABS
1.0000 | ORAL_TABLET | Freq: Once | ORAL | Status: AC
  Administered 2018-05-18: 1 via ORAL
  Filled 2018-05-18: qty 1

## 2018-05-18 MED ORDER — IOHEXOL 350 MG/ML SOLN
100.0000 mL | Freq: Once | INTRAVENOUS | Status: AC | PRN
  Administered 2018-05-18: 18:00:00 100 mL via INTRAVENOUS

## 2018-05-18 NOTE — ED Provider Notes (Addendum)
Care assumed at shift change from [facility]. Briefly, pt presenting with reproducible left flank pain x4 days without other reported symptoms. Pain is worse with movement and palpation. No urinary sx. Pt noted to be tachycardic on arrival, thought to be related to pain. U/A was ordered and CT stone study, pending results at time of handoff.    Upon assuming care, additional history was made available. Pt reporting his wife recently had pneumonia, is undergoing active cancer treatment. 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 Pt is a daily smoker. Pt denies any SOB or cough. Denies fevers or URI sx. No known contacts with COVID positive people, no recent travel. No recent surgery, immobilization, leg pain or swelling.  Throat cancer is listed as an patient's past medical history, however he states he had his tonsils removed w concern for cancer however was found to be negative for cancer.  Patient states he has no cancer history. Physical Exam  BP 139/98 (BP Location: Left Arm)   Pulse (!) 138   Temp 99.3 F (37.4 C) (Oral)   Resp 20   SpO2 99%   Physical Exam Vitals signs and nursing note reviewed.  Constitutional:      General: He is not in acute distress.    Appearance: He is well-developed.  HENT:     Head: Normocephalic and atraumatic.     Mouth/Throat:     Mouth: Mucous membranes are moist.     Pharynx: Oropharynx is clear.  Eyes:     Conjunctiva/sclera: Conjunctivae normal.  Neck:     Musculoskeletal: Normal range of motion and neck supple.  Cardiovascular:     Rate and Rhythm: Regular rhythm. Tachycardia present.     Comments: Normal PT pulses bilaterally Pulmonary:     Effort: Pulmonary effort is normal. No respiratory distress.     Breath sounds: Rales (very faint, Left base) present. No wheezing.     Comments: TTP to left upper posterior ribs and flank. No rash Abdominal:     General: Bowel sounds are normal. There is no distension.      Palpations: Abdomen is soft.     Tenderness: There is no abdominal tenderness. There is left CVA tenderness. There is no guarding or rebound.  Musculoskeletal:        General: No tenderness (No lower extremity tenderness).     Right lower leg: No edema.     Left lower leg: No edema.  Skin:    General: Skin is warm.  Neurological:     Mental Status: He is alert.  Psychiatric:        Behavior: Behavior normal.     ED Course/Procedures   Clinical Course as of May 18 2226  Mon May 17, 7477  [id number] 53 year old male presents with complaint of left flank pain x4 days without any other associated symptoms, denies falls or injuries, history of prior kidney stones.  On exam patient rocking, appears to be in pain.  Pain is worse with movement and palpation, also left-sided CVA tenderness.  Patient was given Norco for pain, sent for CT to evaluate for kidney stone and request urinalysis.  Differential diagnosis includes but not limited to musculoskeletal pain versus ureterolithiasis.  Care signed out pending urine and CT report.   [LM]  [id number] Patient evaluated with additional history. CT with evidence of LLL infiltrate. Labs ordered and antibiotics to cover possible aspiration pneumonia given patient's history of alcohol abuse. Pt discussed with  Dr. Ralene Bathe. D-dimer ordered with concern for possible PE given patient's risk factors, tachycardia and CT findings.   [JR]  9628 Will proceed with CTA  D-dimer, quantitative(!) [JR]  1916 Per radiology, CTA w small segmental PE in left LL with questionable airspace disease vs developing infarct.  Per PESI score, patient is low risk.  Considering discharge, patient ambulated on pulse ox.  Patient evaluated by Dr. Ralene Bathe who agrees with plan.  Will begin Xarelto. Pt agreeable to plan.   [JR]    Clinical Course User Index [JR] , [name], PA-C [LM] Roque Lias    EKG Interpretation  Date/Time:  Monday May 18 2018 16:24:54 EDT Ventricular  Rate:  101 PR Interval:    QRS Duration: 88 QT Interval:  327 QTC Calculation: 424 R Axis:   70 Text Interpretation:  Sinus tachycardia ST elev, probable normal early repol pattern Confirmed by Quintella Reichert [id number]) on 05/18/2018 4:35:21 PM       Results for orders placed or performed during the hospital encounter of 05/18/18  Urinalysis, Routine w reflex microscopic  Result Value Ref Range   Color, Urine AMBER (A) YELLOW   APPearance HAZY (A) CLEAR   Specific Gravity, Urine 1.025 1.005 - 1.030   pH 6.0 5.0 - 8.0   Glucose, UA NEGATIVE NEGATIVE mg/dL   Hgb urine dipstick SMALL (A) NEGATIVE   Bilirubin Urine NEGATIVE NEGATIVE   Ketones, ur 20 (A) NEGATIVE mg/dL   Protein, ur 100 (A) NEGATIVE mg/dL   Nitrite NEGATIVE NEGATIVE   Leukocytes,Ua NEGATIVE NEGATIVE   RBC / HPF 6-10 0 - 5 RBC/hpf   WBC, UA 0-5 0 - 5 WBC/hpf   Bacteria, UA RARE (A) NONE SEEN   Mucus PRESENT   Lactic acid, plasma  Result Value Ref Range   Lactic Acid, Venous 0.7 0.5 - 1.9 mmol/L  Comprehensive metabolic panel  Result Value Ref Range   Sodium 130 (L) 135 - 145 mmol/L   Potassium 3.5 3.5 - 5.1 mmol/L   Chloride 95 (L) 98 - 111 mmol/L   CO2 23 22 - 32 mmol/L   Glucose, Bld 104 (H) 70 - 99 mg/dL   BUN 9 6 - 20 mg/dL   Creatinine, Ser 0.86 0.61 - 1.24 mg/dL   Calcium 9.0 8.9 - 10.3 mg/dL   Total Protein 7.6 6.5 - 8.1 g/dL   Albumin 3.5 3.5 - 5.0 g/dL   AST 20 15 - 41 U/L   ALT 14 0 - 44 U/L   Alkaline Phosphatase 55 38 - 126 U/L   Total Bilirubin 0.6 0.3 - 1.2 mg/dL   GFR calc non Af Amer >60 >60 mL/min   GFR calc Af Amer >60 >60 mL/min   Anion gap 12 5 - 15  CBC WITH DIFFERENTIAL  Result Value Ref Range   WBC 8.0 4.0 - 10.5 K/uL   RBC 4.44 4.22 - 5.81 MIL/uL   Hemoglobin 15.3 13.0 - 17.0 g/dL   HCT 44.6 39.0 - 52.0 %   MCV 100.5 (H) 80.0 - 100.0 fL   MCH 34.5 (H) 26.0 - 34.0 pg   MCHC 34.3 30.0 - 36.0 g/dL   RDW 14.1 11.5 - 15.5 %   Platelets 170 150 - 400 K/uL   nRBC 0.0 0.0 - 0.2 %    Neutrophils Relative % 63 %   Neutro Abs 5.1 1.7 - 7.7 K/uL   Lymphocytes Relative 19 %   Lymphs Abs 1.6 0.7 - 4.0 K/uL  Monocytes Relative 14 %   Monocytes Absolute 1.1 (H) 0.1 - 1.0 K/uL   Eosinophils Relative 2 %   Eosinophils Absolute 0.1 0.0 - 0.5 K/uL   Basophils Relative 1 %   Basophils Absolute 0.1 0.0 - 0.1 K/uL   Immature Granulocytes 1 %   Abs Immature Granulocytes 0.04 0.00 - 0.07 K/uL  D-dimer, quantitative  Result Value Ref Range   D-Dimer, Quant 4.30 (H) 0.00 - 0.50 ug/mL-FEU   Ct Angio Chest Pe W/cm &/or Wo Cm  Result Date: 05/18/2018 CLINICAL DATA:  Left flank pain for 4 days EXAM: CT ANGIOGRAPHY CHEST WITH CONTRAST TECHNIQUE: Multidetector CT imaging of the chest was performed using the standard protocol during bolus administration of intravenous contrast. Multiplanar CT image reconstructions and MIPs were obtained to evaluate the vascular anatomy. CONTRAST:  155mL OMNIPAQUE IOHEXOL 350 MG/ML SOLN COMPARISON:  None. FINDINGS: Cardiovascular: Satisfactory opacification of the pulmonary arteries to the segmental level. Left lower lobe segmental pulmonary embolus. Normal heart size. No pericardial effusion. Mediastinum/Nodes: No enlarged mediastinal, hilar, or axillary lymph nodes. Thyroid gland, trachea, and esophagus demonstrate no significant findings. Lungs/Pleura: Left lower lobe airspace disease. Small left pleural effusion. Upper Abdomen: No acute abnormality. Musculoskeletal: No chest wall abnormality. No acute or significant osseous findings. Review of the MIP images confirms the above findings. IMPRESSION: 1. Small left lower lobe segmental pulmonary embolus. Left lower lobe airspace disease which may reflect atelectasis versus developing infarct. Critical Value/emergent results were called by telephone at the time of interpretation on 05/18/2018 at 7:15 pm to Dr. [place]  , who verbally acknowledged these results. Electronically Signed   By: Kathreen Devoid   On:  05/18/2018 19:17   Ct Renal Stone Study  Result Date: 05/18/2018 CLINICAL DATA:  Flank pain. Stone disease suspected. EXAM: CT ABDOMEN AND PELVIS WITHOUT CONTRAST TECHNIQUE: Multidetector CT imaging of the abdomen and pelvis was performed following the standard protocol without IV contrast. COMPARISON:  CT abdomen dated 09/24/2015. FINDINGS: Lower chest: Dense consolidation within the LEFT lower lobe, most likely pneumonia. Small LEFT pleural effusion. RIGHT lung bases clear. Hepatobiliary: Liver is low in density indicating fatty infiltration. Gallbladder is unremarkable. No bile duct dilatation. Pancreas: Unremarkable. No pancreatic ductal dilatation or surrounding inflammatory changes. Spleen: Normal in size without focal abnormality. Adrenals/Urinary Tract: Adrenal glands appear normal. Kidneys appear normal without mass, stone or hydronephrosis. No ureteral or bladder calculi identified. Bladder is unremarkable, partially decompressed. Bladder walls are circumferentially thickened, likely artifactual due to the bladder decompression. Stomach/Bowel: No dilated large or small bowel loops. No evidence of bowel wall inflammation. Appendix is normal. Stomach is unremarkable. Vascular/Lymphatic: Aortic atherosclerosis. No enlarged lymph nodes seen in the abdomen or pelvis. Reproductive: Prostate is unremarkable. Other: No free fluid or abscess collection within the abdomen or pelvis. No free intraperitoneal air. Musculoskeletal: Mild degenerative spondylosis within the+ lumbar spine. No acute or suspicious osseous finding. IMPRESSION: 1. Dense consolidation within the left lower lobe, most likely pneumonia, alternatively aspiration. Small LEFT pleural effusion, most likely parapneumonic effusion. 2. Fatty infiltration of the liver. 3. Circumferential thickening of the walls of the bladder, but this is most likely artifactual due to the bladder decompression. Recommend correlation with urinalysis to exclude  cystitis. 4. Remainder of the abdomen and pelvis CT is unremarkable. No renal or ureteral calculi. No hydronephrosis or perinephric inflammation. No bowel obstruction or evidence of bowel wall inflammation. Appendix is normal. Aortic Atherosclerosis (ICD10-I70.0). Electronically Signed   By: Franki Cabot M.D.   On: 05/18/2018  15:58    Procedures CRITICAL CARE Performed by: [name]    Total critical care time: 35 minutes  Critical care time was exclusive of separately billable procedures and treating other patients.  Critical care was necessary to treat or prevent imminent or life-threatening deterioration.  Critical care was time spent personally by me on the following activities: development of treatment plan with patient and/or surrogate as well as nursing, discussions with consultants, evaluation of patient's response to treatment, examination of patient, obtaining history from patient or surrogate, ordering and performing treatments and interventions, ordering and review of laboratory studies, ordering and review of radiographic studies, pulse oximetry and re-evaluation of patient's condition.  MDM  Pt with small segmental PE in left lower lobe with possible developing infarct. NO evidence of heart strain. EKG with early repolarization pattern, no evidence of ischemia. Pt without infectious sx, no white count, no cough, no fever. Unlikely additional underlying PNA. For this reason, pt not discharged with antibiotics. Pt remains stable throughout ED stay, has no SOB. O2 saturation normal. Pt ambulated in the ED without SOB or hypoxia. Pt given initial dose of xarelto and discharged with rx. Small rx for pain medication, instructed to avoid alcohol use with this medication. Pt has PCP and verbalized understanding of the need for close follow up. Pt agreeable with plan and discharged in no distress.  BP 129/88 (BP Location: Left Arm)   Pulse (!) 107   Temp 98 F (36.7 C) (Oral)    Resp (!) 24   SpO2 98%   Patient was discussed with and evaluated by Dr. Ralene Bathe.  [place] Controlled Substance reporting System queried  Discussed results, findings, treatment and follow up. Patient advised of return precautions. Patient verbalized understanding and agreed with plan.      , [name], PA-C 05/18/18 2233    , [name], PA-C 05/18/18 2234    Quintella Reichert, MD 05/19/18 1329

## 2018-05-18 NOTE — ED Notes (Addendum)
Pt ambulated length of unit hall way HR increased from 107 BPM  To 124 BPM pt denies increased SOB but admits to increased left back and rib area pain 9/10 scale. O2 saturation remain 94% to 98% while ambulating.

## 2018-05-18 NOTE — ED Provider Notes (Addendum)
[facility] DEPT Provider Note   CSN: [id number] Arrival date & time: 05/18/18  1403    History   Chief Complaint Chief Complaint  Patient presents with  . Flank Pain    HPI Edwin Singh is a 53 y.o. male.     53 year old male presents with complaint of left flank pain for the past 4 days.  Pain is worse with palpation and movement, improves with rest.  Denies associated nausea, vomiting, fevers, chills, changes in bowel or bladder habits, blood in urine.  No prior kidney stones.  Denies falls or injuries.  No other complaints or concerns.     Past Medical History:  Diagnosis Date  . Cancer (HCC)    throat cancer  . Chronic cough   . Myocardial infarction ([place])    10 years ago    There are no active problems to display for this patient.   Past Surgical History:  Procedure Laterality Date  . arm surgery    . NERVE SURGERY Left    arm  . TONSILLECTOMY Right 08/17/2015   Procedure: TONSILLECTOMY;  Surgeon: Carloyn Manner, MD;  Location: [facility] ORS;  Service: ENT;  Laterality: Right;        Home Medications    Prior to Admission medications   Medication Sig Start Date End Date Taking? Authorizing Provider  HYDROcodone-acetaminophen (NORCO/VICODIN) 5-325 MG tablet Take 1-2 tablets by mouth every 6 (six) hours as needed for severe pain. 05/18/18   [name], [name], PA-C  Rivaroxaban 15 & 20 MG TBPK Take as directed on package: Start with one 54m tablet by mouth twice a day with food. On Day 22, switch to one 244mtablet once a day with food. 05/18/18   [name], [name], PA-C    Family History No family history on file.  Social History Social History   Tobacco Use  . Smoking status: Current Every Day Smoker    Packs/day: 1.50    Years: 30.00    Pack years: 45.00    Types: Cigarettes  . Smokeless tobacco: Never Used  Substance Use Topics  . Alcohol use: Yes    Alcohol/week: 20.0 standard drinks    Types: 20 Cans of beer  per week    Comment: 40 oz beer per day  . Drug use: Yes    Types: Marijuana     Allergies   Penicillins   Review of Systems Review of Systems  Constitutional: Negative for fever.  Respiratory: Negative for shortness of breath.   Cardiovascular: Negative for chest pain.  Gastrointestinal: Negative for abdominal pain, constipation, diarrhea, nausea and vomiting.  Genitourinary: Negative for dysuria, frequency, hematuria and urgency.  Musculoskeletal: Positive for back pain. Negative for gait problem.  Skin: Negative for color change, rash and wound.  Allergic/Immunologic: Negative for immunocompromised state.  Neurological: Negative for weakness.  Hematological: Negative for adenopathy. Does not bruise/bleed easily.  Psychiatric/Behavioral: Negative for confusion.     Physical Exam Updated Vital Signs BP 129/88 (BP Location: Left Arm)   Pulse (!) 107   Temp 98 F (36.7 C) (Oral)   Resp (!) 24   SpO2 98%   Physical Exam Vitals signs and nursing note reviewed.  Constitutional:      General: He is not in acute distress.    Appearance: He is well-developed. He is not diaphoretic.  HENT:     Head: Normocephalic and atraumatic.  Cardiovascular:     Rate and Rhythm: Regular rhythm. Tachycardia present.  Pulses: Normal pulses.     Heart sounds: Normal heart sounds.  Pulmonary:     Effort: Pulmonary effort is normal.     Breath sounds: Normal breath sounds.  Abdominal:     General: Abdomen is flat.     Palpations: Abdomen is soft.     Tenderness: There is abdominal tenderness. There is left CVA tenderness. There is no right CVA tenderness.  Musculoskeletal:        General: Tenderness present.     Lumbar back: He exhibits decreased range of motion and tenderness. He exhibits no bony tenderness.       Back:  Skin:    General: Skin is warm and dry.     Findings: No erythema or rash.  Neurological:     Mental Status: He is alert and oriented to person, place, and  time.  Psychiatric:        Behavior: Behavior normal.      ED Treatments / Results  Labs (all labs ordered are listed, but only abnormal results are displayed) Labs Reviewed  URINALYSIS, ROUTINE W REFLEX MICROSCOPIC - Abnormal; Notable for the following components:      Result Value   Color, Urine AMBER (*)    APPearance HAZY (*)    Hgb urine dipstick SMALL (*)    Ketones, ur 20 (*)    Protein, ur 100 (*)    Bacteria, UA RARE (*)    All other components within normal limits  COMPREHENSIVE METABOLIC PANEL - Abnormal; Notable for the following components:   Sodium 130 (*)    Chloride 95 (*)    Glucose, Bld 104 (*)    All other components within normal limits  CBC WITH DIFFERENTIAL/PLATELET - Abnormal; Notable for the following components:   MCV 100.5 (*)    MCH 34.5 (*)    Monocytes Absolute 1.1 (*)    All other components within normal limits  D-DIMER, QUANTITATIVE (NOT AT [facility]) - Abnormal; Notable for the following components:   D-Dimer, Quant 4.30 (*)    All other components within normal limits  CULTURE, BLOOD (ROUTINE X 2)  CULTURE, BLOOD (ROUTINE X 2)  LACTIC ACID, PLASMA    EKG EKG Interpretation  Date/Time:  Monday May 18 2018 16:24:54 EDT Ventricular Rate:  101 PR Interval:    QRS Duration: 88 QT Interval:  327 QTC Calculation: 424 R Axis:   70 Text Interpretation:  Sinus tachycardia ST elev, probable normal early repol pattern Confirmed by Quintella Reichert [phone]) on 05/18/2018 4:35:21 PM Also confirmed by Quintella Reichert [phone]), editor Philomena Doheny [phone])  on 05/20/2018 8:02:53 AM   Radiology Ct Angio Chest Pe W/cm &/or Wo Cm  Result Date: 05/18/2018 CLINICAL DATA:  Left flank pain for 4 days EXAM: CT ANGIOGRAPHY CHEST WITH CONTRAST TECHNIQUE: Multidetector CT imaging of the chest was performed using the standard protocol during bolus administration of intravenous contrast. Multiplanar CT image reconstructions and MIPs were obtained to evaluate the  vascular anatomy. CONTRAST:  141m OMNIPAQUE IOHEXOL 350 MG/ML SOLN COMPARISON:  None. FINDINGS: Cardiovascular: Satisfactory opacification of the pulmonary arteries to the segmental level. Left lower lobe segmental pulmonary embolus. Normal heart size. No pericardial effusion. Mediastinum/Nodes: No enlarged mediastinal, hilar, or axillary lymph nodes. Thyroid gland, trachea, and esophagus demonstrate no significant findings. Lungs/Pleura: Left lower lobe airspace disease. Small left pleural effusion. Upper Abdomen: No acute abnormality. Musculoskeletal: No chest wall abnormality. No acute or significant osseous findings. Review of the MIP images confirms the above findings. IMPRESSION:  1. Small left lower lobe segmental pulmonary embolus. Left lower lobe airspace disease which may reflect atelectasis versus developing infarct. Critical Value/emergent results were called by telephone at the time of interpretation on 05/18/2018 at 7:15 pm to Dr. [place] [name] , who verbally acknowledged these results. Electronically Signed   By: Kathreen Devoid   On: 05/18/2018 19:17   Ct Renal Stone Study  Result Date: 05/18/2018 CLINICAL DATA:  Flank pain. Stone disease suspected. EXAM: CT ABDOMEN AND PELVIS WITHOUT CONTRAST TECHNIQUE: Multidetector CT imaging of the abdomen and pelvis was performed following the standard protocol without IV contrast. COMPARISON:  CT abdomen dated 09/24/2015. FINDINGS: Lower chest: Dense consolidation within the LEFT lower lobe, most likely pneumonia. Small LEFT pleural effusion. RIGHT lung bases clear. Hepatobiliary: Liver is low in density indicating fatty infiltration. Gallbladder is unremarkable. No bile duct dilatation. Pancreas: Unremarkable. No pancreatic ductal dilatation or surrounding inflammatory changes. Spleen: Normal in size without focal abnormality. Adrenals/Urinary Tract: Adrenal glands appear normal. Kidneys appear normal without mass, stone or hydronephrosis. No ureteral or  bladder calculi identified. Bladder is unremarkable, partially decompressed. Bladder walls are circumferentially thickened, likely artifactual due to the bladder decompression. Stomach/Bowel: No dilated large or small bowel loops. No evidence of bowel wall inflammation. Appendix is normal. Stomach is unremarkable. Vascular/Lymphatic: Aortic atherosclerosis. No enlarged lymph nodes seen in the abdomen or pelvis. Reproductive: Prostate is unremarkable. Other: No free fluid or abscess collection within the abdomen or pelvis. No free intraperitoneal air. Musculoskeletal: Mild degenerative spondylosis within the+ lumbar spine. No acute or suspicious osseous finding. IMPRESSION: 1. Dense consolidation within the left lower lobe, most likely pneumonia, alternatively aspiration. Small LEFT pleural effusion, most likely parapneumonic effusion. 2. Fatty infiltration of the liver. 3. Circumferential thickening of the walls of the bladder, but this is most likely artifactual due to the bladder decompression. Recommend correlation with urinalysis to exclude cystitis. 4. Remainder of the abdomen and pelvis CT is unremarkable. No renal or ureteral calculi. No hydronephrosis or perinephric inflammation. No bowel obstruction or evidence of bowel wall inflammation. Appendix is normal. Aortic Atherosclerosis (ICD10-I70.0). Electronically Signed   By: Franki Cabot M.D.   On: 05/18/2018 15:58    Procedures Procedures (including critical care time)  Medications Ordered in ED Medications  HYDROcodone-acetaminophen (NORCO/VICODIN) 5-325 MG per tablet 1 tablet (1 tablet Oral Given 05/18/18 1511)  sodium chloride 0.9 % bolus 1,000 mL (0 mLs Intravenous Stopped 05/18/18 2137)  levofloxacin (LEVAQUIN) tablet 750 mg (750 mg Oral Given 05/18/18 1631)  metroNIDAZOLE (FLAGYL) tablet 500 mg (500 mg Oral Given 05/18/18 1631)  iohexol (OMNIPAQUE) 350 MG/ML injection 100 mL (100 mLs Intravenous Contrast Given 05/18/18 1825)  Rivaroxaban  (XARELTO) tablet 15 mg (15 mg Oral Given 05/18/18 2030)  rivaroxaban [employer] Reasons) Education Kit for DVT/PE patients ( Does not apply Given 05/18/18 2045)  HYDROcodone-acetaminophen (NORCO/VICODIN) 5-325 MG per tablet 1 tablet (1 tablet Oral Given 05/18/18 2036)     Initial Impression / Assessment and Plan / ED Course  I have reviewed the triage vital signs and the nursing notes.  Pertinent labs & imaging results that were available during my care of the patient were reviewed by me and considered in my medical decision making (see chart for details).  Clinical Course as of May 19 1444  Mon May 18, 3579  3339 53 year old male presents with complaint of left flank pain x4 days without any other associated symptoms, denies falls or injuries, history of prior kidney stones.  On exam patient rocking, appears to  be in pain.  Pain is worse with movement and palpation, also left-sided CVA tenderness.  Patient was given Norco for pain, sent for CT to evaluate for kidney stone and request urinalysis.  Differential diagnosis includes but not limited to musculoskeletal pain versus ureterolithiasis.  Care signed out pending urine and CT report.   [LM]  [id number] Patient evaluated with additional history. CT with evidence of LLL infiltrate. Labs ordered and antibiotics to cover possible aspiration pneumonia given patient's history of alcohol abuse. Pt discussed with Dr. Ralene Bathe. D-dimer ordered with concern for possible PE given patient's risk factors, tachycardia and CT findings.   [JR]  5488 Will proceed with CTA  D-dimer, quantitative(!) [JR]  1916 Per radiology, CTA w small segmental PE in left LL with questionable airspace disease vs developing infarct.  Per PESI score, patient is low risk.  Considering discharge, patient ambulated on pulse ox.  Patient evaluated by Dr. Ralene Bathe who agrees with plan.  Will begin Xarelto. Pt agreeable to plan.   [JR]    Clinical Course User Index [JR] [name], [name], PA-C [LM]  Tacy Learn, PA-C     Final Clinical Impressions(s) / ED Diagnoses   Final diagnoses:  Single subsegmental pulmonary embolism without acute cor pulmonale    ED Discharge Orders         Ordered    Rivaroxaban 15 & 20 MG TBPK     05/18/18 2104    HYDROcodone-acetaminophen (NORCO/VICODIN) 5-325 MG tablet  Every 6 hours PRN     05/18/18 2104           Tacy Learn, PA-C 05/18/18 1513    Tegeler, Gwenyth Allegra, MD 05/18/18 1616    Tacy Learn, PA-C 05/20/18 1446    Tegeler, Gwenyth Allegra, MD 05/21/18 2318

## 2018-05-18 NOTE — ED Triage Notes (Signed)
Left sided flank pain that started 4 days ago. Pain 10/10, denies any other associated symptoms. Describes the pain as constant.

## 2018-05-18 NOTE — Discharge Instructions (Addendum)
Take the blood thinner as prescribed.  It is important that you do not miss any doses.  It is recommended that you do not take any NSAIDs with this medication, including Advil/Motrin/ibuprofen, Aleve/naproxen, aspirin. You can take the hydrocodone every 6 hours as needed for severe pain.  Do not drink alcohol with this medication. Schedule a close follow-up appointment your primary care provider. Return the emergency department immediately if you develop worsening shortness of breath, bleeding, or new or worsening symptoms.

## 2018-05-19 MED FILL — HYDROCODON-APAP 5-325: 5-325 | 1 days supply | Qty: 6 | Fill #0

## 2018-05-20 MED FILL — XARELTO STARTER PACK: 15 & 20 | 30 days supply | Qty: 51 | Fill #0

## 2018-05-23 LAB — CULTURE, BLOOD (ROUTINE X 2)
Culture: NO GROWTH
Culture: NO GROWTH
Special Requests: ADEQUATE
Special Requests: ADEQUATE

## 2018-06-17 ENCOUNTER — Emergency Department (HOSPITAL_COMMUNITY)
Admission: EM | Admit: 2018-06-17 | Discharge: 2018-06-17 | Disposition: A | Discharge status: Home / Self Care | Payer: Self-pay | Attending: Emergency Medicine | Admitting: Emergency Medicine

## 2018-06-17 ENCOUNTER — Other Ambulatory Visit: Payer: Self-pay

## 2018-06-17 ENCOUNTER — Emergency Department (HOSPITAL_COMMUNITY): Payer: Self-pay

## 2018-06-17 DIAGNOSIS — F17210 Nicotine dependence, cigarettes, uncomplicated: Secondary | ICD-10-CM | POA: Insufficient documentation

## 2018-06-17 DIAGNOSIS — I252 Old myocardial infarction: Secondary | ICD-10-CM | POA: Insufficient documentation

## 2018-06-17 DIAGNOSIS — R0789 Other chest pain: Secondary | ICD-10-CM | POA: Insufficient documentation

## 2018-06-17 DIAGNOSIS — Z7901 Long term (current) use of anticoagulants: Secondary | ICD-10-CM | POA: Insufficient documentation

## 2018-06-17 LAB — CBC WITH DIFFERENTIAL/PLATELET
Abs Immature Granulocytes: 0.01 10*3/uL (ref 0.00–0.07)
Basophils Absolute: 0.1 10*3/uL (ref 0.0–0.1)
Basophils Relative: 1 %
Eosinophils Absolute: 0.4 10*3/uL (ref 0.0–0.5)
Eosinophils Relative: 6 %
HCT: 46.3 % (ref 39.0–52.0)
Hemoglobin: 15.7 g/dL (ref 13.0–17.0)
Immature Granulocytes: 0 %
Lymphocytes Relative: 48 %
Lymphs Abs: 3.2 10*3/uL (ref 0.7–4.0)
MCH: 33.5 pg (ref 26.0–34.0)
MCHC: 33.9 g/dL (ref 30.0–36.0)
MCV: 98.7 fL (ref 80.0–100.0)
Monocytes Absolute: 0.4 10*3/uL (ref 0.1–1.0)
Monocytes Relative: 7 %
Neutro Abs: 2.6 10*3/uL (ref 1.7–7.7)
Neutrophils Relative %: 38 %
Platelets: 166 10*3/uL (ref 150–400)
RBC: 4.69 MIL/uL (ref 4.22–5.81)
RDW: 13.9 % (ref 11.5–15.5)
WBC: 6.7 10*3/uL (ref 4.0–10.5)
nRBC: 0 % (ref 0.0–0.2)

## 2018-06-17 LAB — BASIC METABOLIC PANEL
Anion gap: 12 (ref 5–15)
BUN: 9 mg/dL (ref 6–20)
CO2: 21 mmol/L — ABNORMAL LOW (ref 22–32)
Calcium: 9 mg/dL (ref 8.9–10.3)
Chloride: 106 mmol/L (ref 98–111)
Creatinine, Ser: 0.74 mg/dL (ref 0.61–1.24)
GFR calc Af Amer: >60 mL/min (ref >60)
GFR calc non Af Amer: >60 mL/min (ref >60)
Glucose, Bld: 90 mg/dL (ref 70–99)
Potassium: 3.6 mmol/L (ref 3.5–5.1)
Sodium: 139 mmol/L (ref 135–145)

## 2018-06-17 LAB — TROPONIN I: Troponin I: <0.03 ng/mL (ref <0.03)

## 2018-06-17 NOTE — ED Triage Notes (Signed)
PT reports left arm pain that "feels like there is a tourniquet on there" and reports "sharp, pressure" on his chest that feels like "Someone is pushing a high heel into his chest" Pt has a hx of PE and is on xarelto

## 2018-06-17 NOTE — Discharge Instructions (Addendum)
Please read instructions below. Take Tylenol every 4-6 hours as needed for chest wall pain.  You can apply ice or heat for your symptoms. Follow up with your primary care provider regarding your visit today.  Return to the ER for new or worsening symptoms; including worsening chest pain, shortness of breath, pain that radiates to the arm or neck, pain or shortness of breath worsened with exertion.

## 2018-06-17 NOTE — ED Provider Notes (Signed)
[facility] DEPT Provider Note   CSN: [id number] Arrival date & time: 06/17/18  1709    History   Chief Complaint Chief Complaint  Patient presents with  . Chest Pain    HPI Edwin Singh is a 53 y.o. male with past medical history of MI, recent PE on Xarelto, presenting to the emergency department with complaint of 3 weeks of central sharp chest pain that seems constant however is worse with movement.  He states the pain in his chest is in one particular place in the lower sternum.  He denies cough, fever, shortness of breath, nausea, diaphoresis, abdominal pain.  No history of GERD or heartburn.  No known injury.  He states he does feel like he has a tight band around his left arm, however this is been ongoing since before he was diagnosed with a PE on 05/18/2018.  He states he has been compliant with the Xarelto and has not missed any doses.  This feels very different than his PE. Per chart review, normal stress test in 2012.  No contact with COVID positive people.     The history is provided by the patient and medical records.    Past Medical History:  Diagnosis Date  . Cancer (HCC)    throat cancer  . Chronic cough   . Myocardial infarction ([place])    10 years ago    There are no active problems to display for this patient.   Past Surgical History:  Procedure Laterality Date  . arm surgery    . NERVE SURGERY Left    arm  . TONSILLECTOMY Right 08/17/2015   Procedure: TONSILLECTOMY;  Surgeon: Carloyn Manner, MD;  Location: [facility] ORS;  Service: ENT;  Laterality: Right;        Home Medications    Prior to Admission medications   Medication Sig Start Date End Date Taking? Authorizing Provider  rivaroxaban (XARELTO) 20 MG TABS tablet Take 20 mg by mouth daily.   Yes [provider]  HYDROcodone-acetaminophen (NORCO/VICODIN) 5-325 MG tablet Take 1-2 tablets by mouth every 6 (six) hours as needed for severe pain. Patient not  taking: Reported on 06/17/2018 05/18/18   Savion Washam, [name], PA-C  Rivaroxaban 15 & 20 MG TBPK Take as directed on package: Start with one 15mg  tablet by mouth twice a day with food. On Day 22, switch to one 20mg  tablet once a day with food. Patient not taking: Reported on 06/17/2018 05/18/18   Genora Arp, [name], PA-C    Family History No family history on file.  Social History Social History   Tobacco Use  . Smoking status: Current Every Day Smoker    Packs/day: 1.50    Years: 30.00    Pack years: 45.00    Types: Cigarettes  . Smokeless tobacco: Never Used  Substance Use Topics  . Alcohol use: Yes    Alcohol/week: 20.0 standard drinks    Types: 20 Cans of beer per week    Comment: 40 oz beer per day  . Drug use: Yes    Types: Marijuana     Allergies   Penicillins   Review of Systems Review of Systems  Constitutional: Negative for diaphoresis.  Respiratory: Negative for cough.   Cardiovascular: Positive for chest pain. Negative for leg swelling.  Gastrointestinal: Negative for abdominal pain and nausea.  All other systems reviewed and are negative.    Physical Exam Updated Vital Signs BP 128/89 (BP Location: Right Arm)   Pulse 96  Temp 98.9 F (37.2 C) (Oral)   Resp (!) 21   SpO2 97%   Physical Exam Vitals signs and nursing note reviewed.  Constitutional:      General: He is not in acute distress.    Appearance: He is well-developed. He is not ill-appearing.  HENT:     Head: Normocephalic and atraumatic.  Eyes:     Conjunctiva/sclera: Conjunctivae normal.  Cardiovascular:     Rate and Rhythm: Normal rate and regular rhythm.     Pulses: Normal pulses.  Pulmonary:     Effort: Pulmonary effort is normal.     Breath sounds: Normal breath sounds.  Chest:     Chest wall: Tenderness present.       Comments: Focal tenderness to palpation of the lower sternum.  No crepitus.  No skin changes. Abdominal:     Palpations: Abdomen is soft.  Musculoskeletal:      Right lower leg: No edema.     Left lower leg: No edema.  Skin:    General: Skin is warm.  Neurological:     Mental Status: He is alert.  Psychiatric:        Behavior: Behavior normal.      ED Treatments / Results  Labs (all labs ordered are listed, but only abnormal results are displayed) Labs Reviewed  BASIC METABOLIC PANEL - Abnormal; Notable for the following components:      Result Value   CO2 21 (*)    All other components within normal limits  CBC WITH DIFFERENTIAL/PLATELET  TROPONIN I    EKG EKG Interpretation  Date/Time:  Wednesday Jun 17 2018 17:19:20 EDT Ventricular Rate:  103 PR Interval:    QRS Duration: 89 QT Interval:  327 QTC Calculation: 428 R Axis:   73 Text Interpretation:  Sinus tachycardia Probable left atrial enlargement RSR' in V1 or V2, right VCD or RVH Confirmed by Julianne Rice [phone]) on 06/17/2018 5:22:01 PM   Radiology Dg Chest 2 View  Result Date: 06/17/2018 CLINICAL DATA:  Chest pain EXAM: CHEST - 2 VIEW COMPARISON:  Chest x-ray dated 10/04/2010 FINDINGS: The heart size and mediastinal contours are within normal limits. Both lungs are clear. The visualized skeletal structures are unremarkable. IMPRESSION: No active cardiopulmonary disease. Electronically Signed   By: Constance Holster M.D.   On: 06/17/2018 18:11    Procedures Procedures (including critical care time)  Medications Ordered in ED Medications - No data to display   Initial Impression / Assessment and Plan / ED Course  I have reviewed the triage vital signs and the nursing notes.  Pertinent labs & imaging results that were available during my care of the patient were reviewed by me and considered in my medical decision making (see chart for details).        Pt presenting with central focal chest pain that is worse with exertion for 3 weeks. Chest pain is not likely of cardiac or pulmonary etiology d/t presentation, VSS, no tracheal deviation, no JVD or new  murmur, RRR, breath sounds equal bilaterally, EKG without acute abnormalities, negative troponin, and negative CXR.  Chest pain is atypical and reproducible on exam.  Low heart score of 3.  Patient is compliant with Xarelto after recent PE.  Doubt recurrence of this, patient's symptoms are different.  Patient is to be discharged with recommendation to follow up with PCP in regards to today's hospital visit. Pt has been advised to return to the ED if CP becomes exertional, associated with diaphoresis or  nausea, radiates to left jaw/arm, worsens or becomes concerning in any way. Pt appears reliable for follow up and is agreeable to discharge.   Discussed patient with Dr. Lita Mains, who agrees with workup and care plan.  Discussed results, findings, treatment and follow up. Patient advised of return precautions. Patient verbalized understanding and agreed with plan.  Final Clinical Impressions(s) / ED Diagnoses   Final diagnoses:  Chest wall pain    ED Discharge Orders    None       Mellisa Arshad, [name], PA-C 06/17/18 2035    Julianne Rice, MD 06/17/18 2241

## 2020-11-10 IMAGING — CT CT HEAD CODE STROKE W/O CM
3 series · 16 of 47 positions shown, 19 images · non-contrast
Comparison: 09/06/2009 CT head.

CLINICAL DATA: Code stroke.  52 y/o  M; left-sided weakness.

EXAM:
CT HEAD WITHOUT CONTRAST
TECHNIQUE: Contiguous axial images were obtained from the base of the skull
through the vertex without intravenous contrast.

[Series 3: head 5.0 st · axial · 0.43mm/px · z∈[+1196,+1346]mm · 10 of 36 slices shown, 13 images]
[im 3/36  brain]
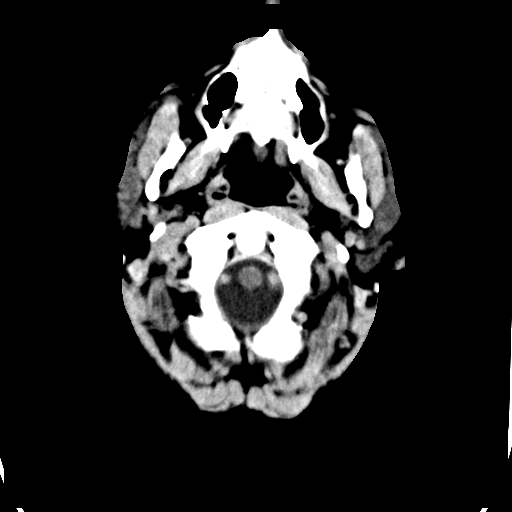
[im 3/36  bone]
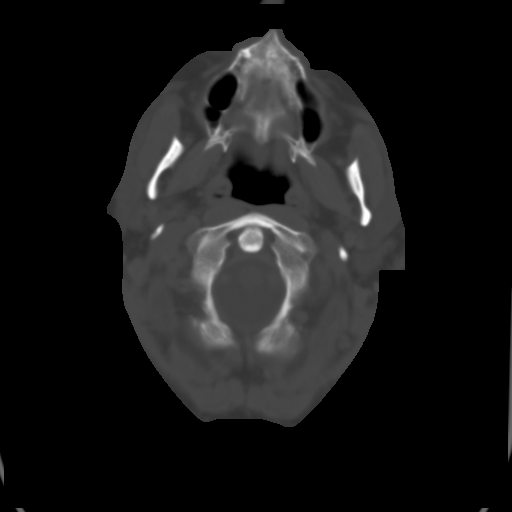
[im 7/36  brain]
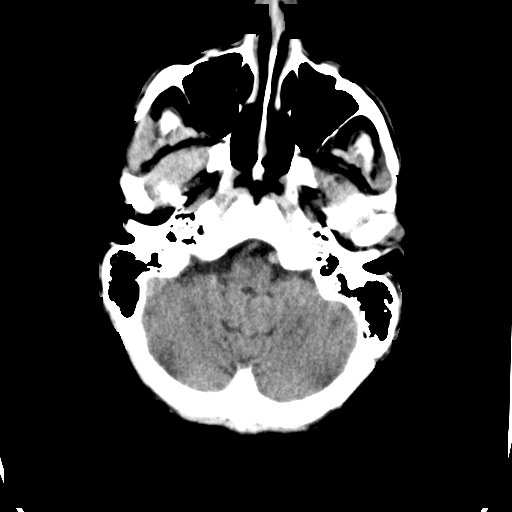
[im 10/36  brain]
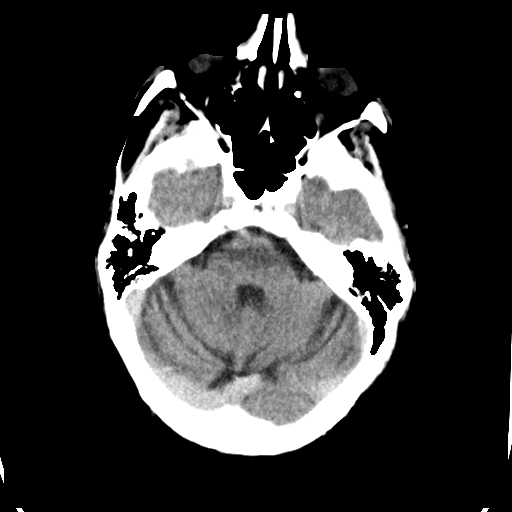
[im 13/36  brain]
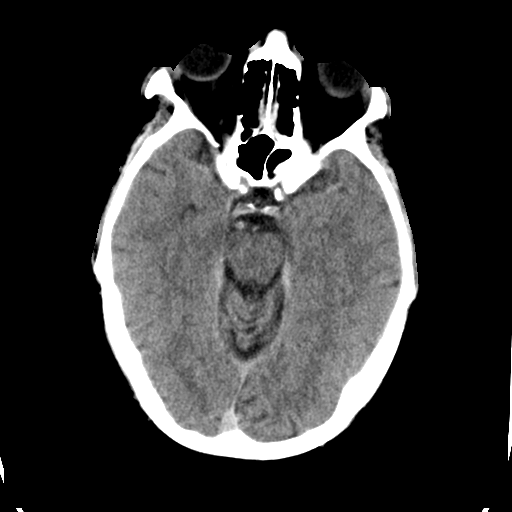
[im 16/36  brain]
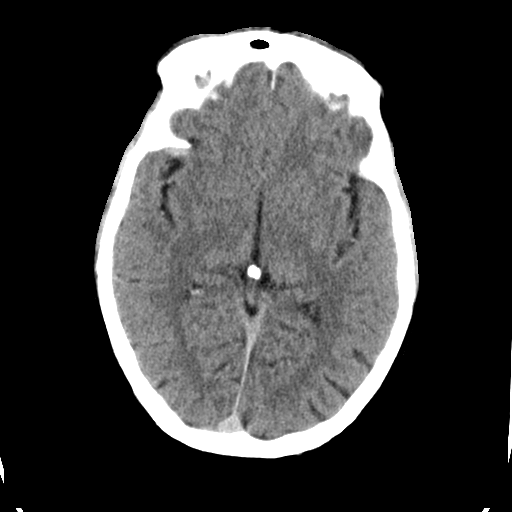
[im 16/36  bone]
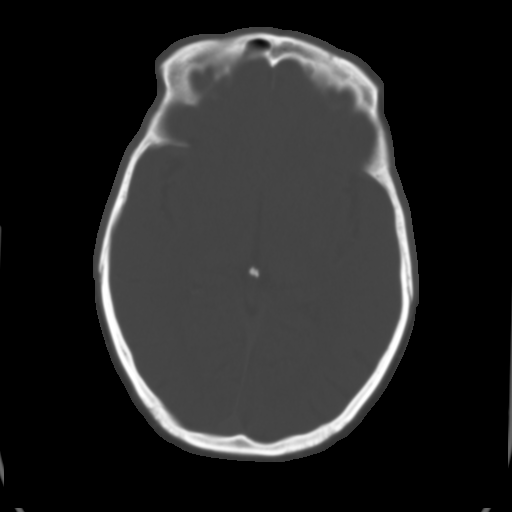
[im 20/36  brain]
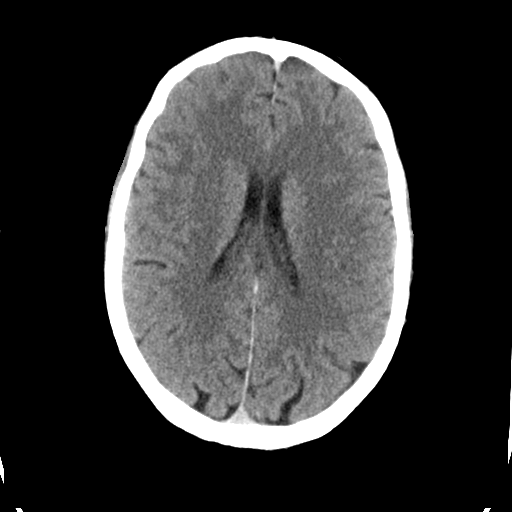
[im 23/36  brain]
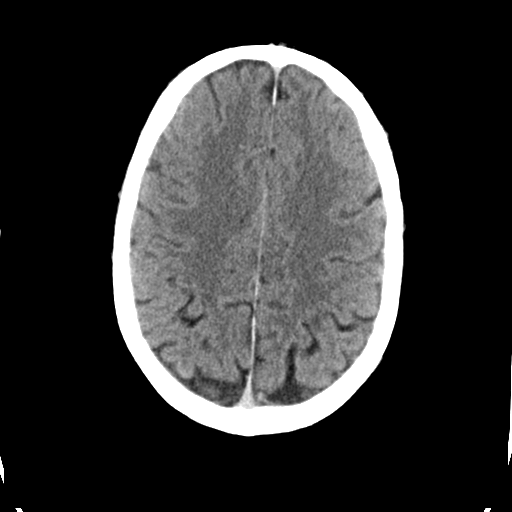
[im 27/36  brain]
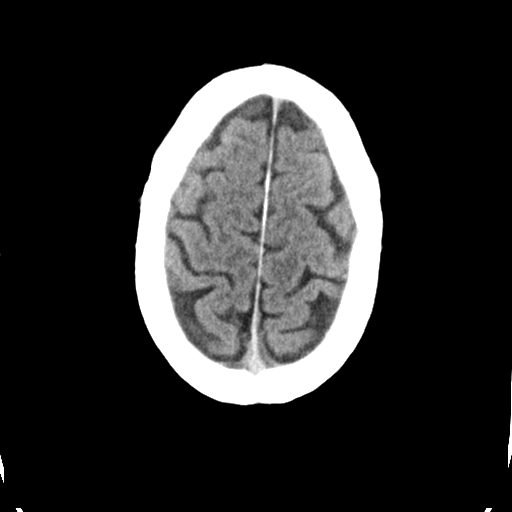
[im 29/36  brain]
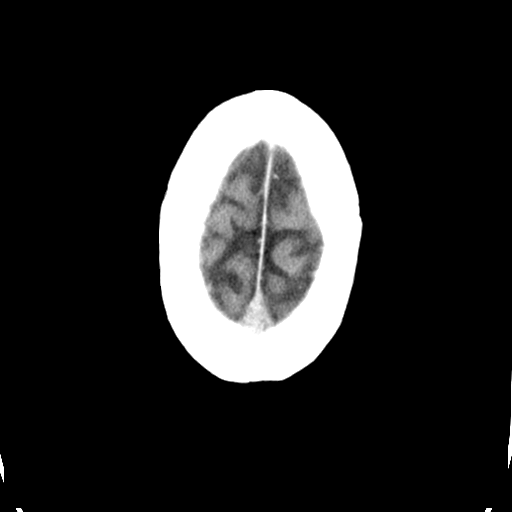
[im 29/36  bone]
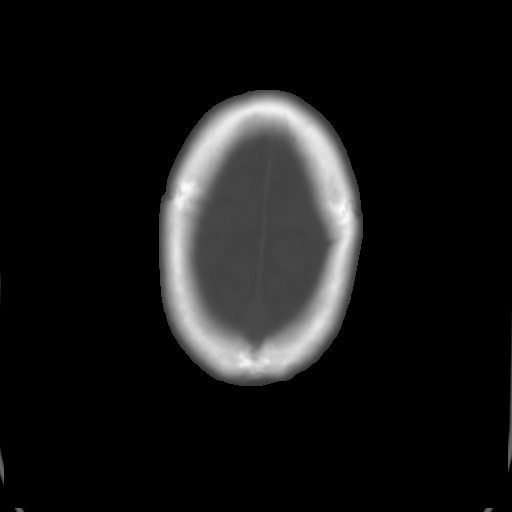
[im 33/36  brain]
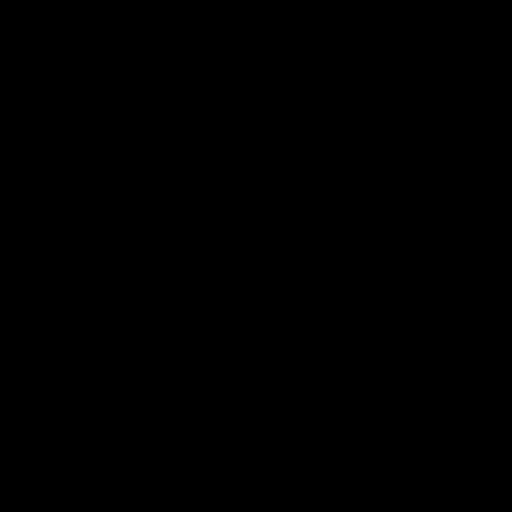

[Series 5: head 3.0 cor st · coronal · 0.33mm/px · 3 of 68 slices shown]
[im 23/68  brain]
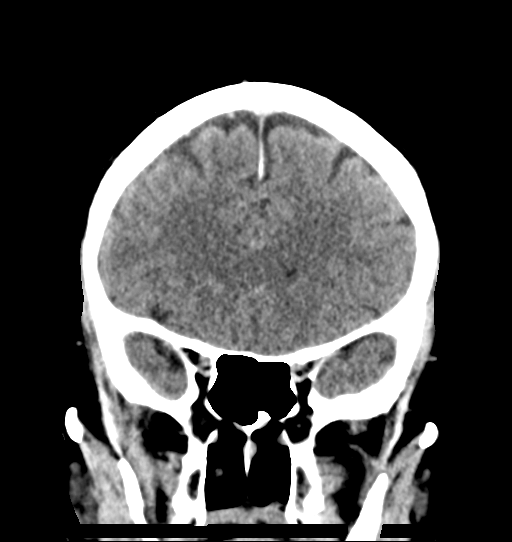
[im 30/68  brain]
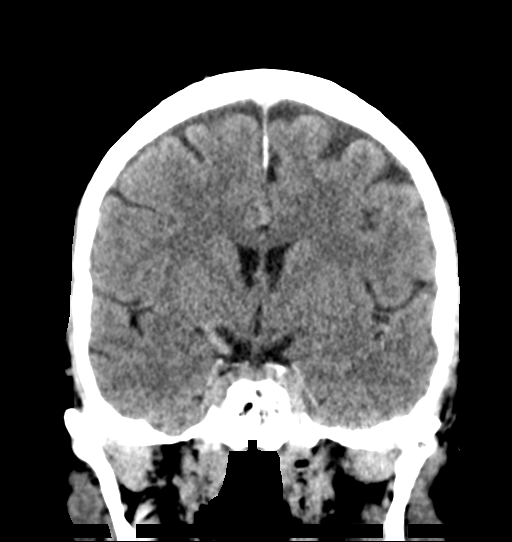
[im 38/68  brain]
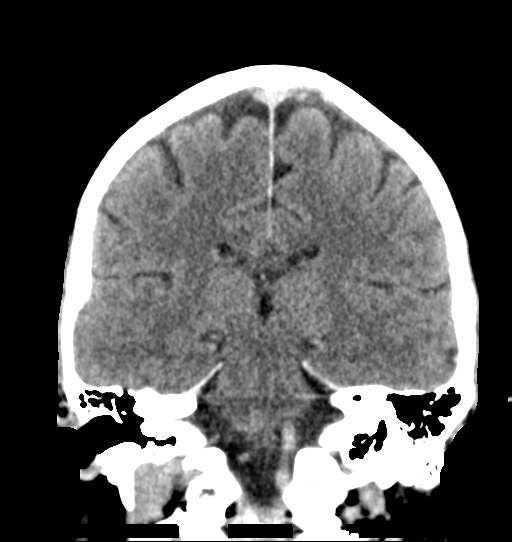

[Series 6: head 3.0 sag st · sagittal · 0.33mm/px · 3 of 67 slices shown]
[im 23/67  brain]
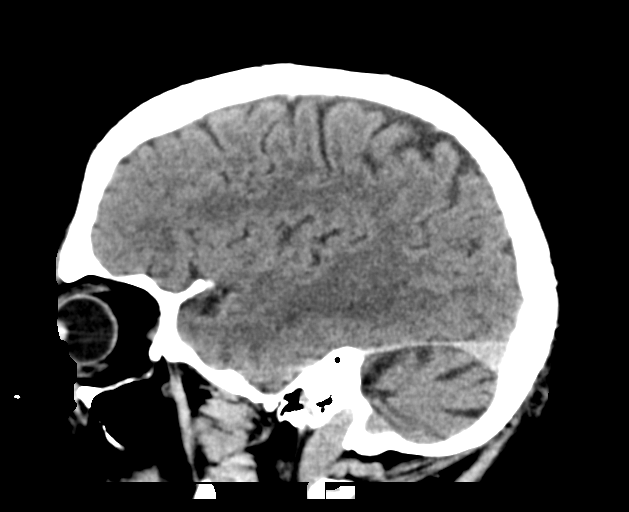
[im 34/67  brain]
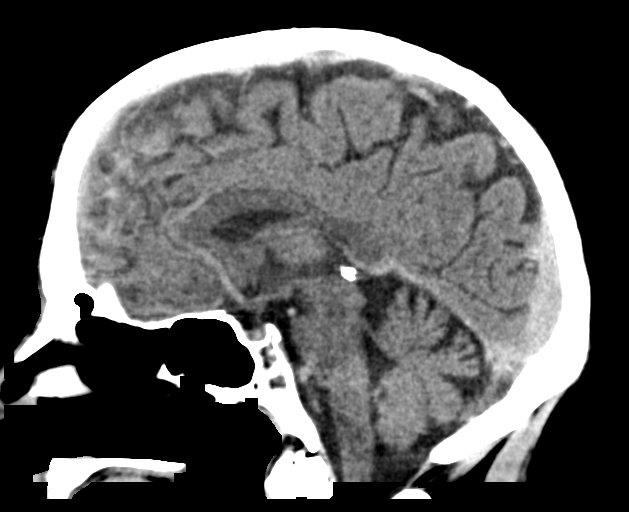
[im 45/67  brain]
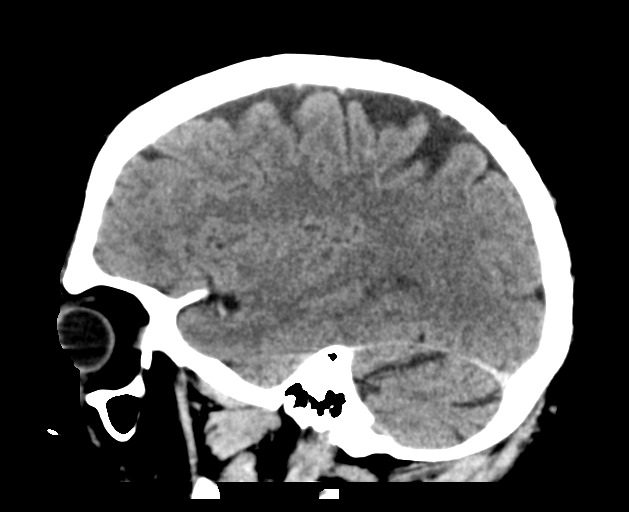

[16 of 47 positions shown; findings below may reference images not displayed]

FINDINGS: Brain: No evidence of acute infarction, hemorrhage, hydrocephalus,
extra-axial collection or mass lesion/mass effect.

Vascular: No hyperdense vessel or unexpected calcification.

Skull: Normal. Negative for fracture or focal lesion.

Sinuses/Orbits: No acute finding.

Other: None.

ASPECTS (Alberta Stroke Program Early CT Score)

- Ganglionic level infarction (caudate, lentiform nuclei, internal
capsule, insula, M1-M3 cortex): 7

- Supraganglionic infarction (M4-M6 cortex): 3

Total score (0-10 with 10 being normal): 10
IMPRESSION: 1. No acute intracranial abnormality identified. Unremarkable CT of
the head.
2. ASPECTS is 10.

These results were communicated to Dr. Gruban at [DATE] pmon 04/13/2018by
text page via the [employer] messaging system.
